# Patient Record
Sex: Female | Born: 1995 | Race: Black or African American | Hispanic: No | Marital: Single | Smoking: Never smoker
Health system: Southern US, Community
[De-identification: ages and names within clinical notes are randomized; demographics above are authoritative.]

## PROBLEM LIST (undated history)

## (undated) ENCOUNTER — Inpatient Hospital Stay (HOSPITAL_COMMUNITY): Payer: Self-pay

## (undated) DIAGNOSIS — N390 Urinary tract infection, site not specified: Secondary | ICD-10-CM

## (undated) DIAGNOSIS — O139 Gestational [pregnancy-induced] hypertension without significant proteinuria, unspecified trimester: Secondary | ICD-10-CM

## (undated) DIAGNOSIS — A749 Chlamydial infection, unspecified: Secondary | ICD-10-CM

## (undated) DIAGNOSIS — L732 Hidradenitis suppurativa: Secondary | ICD-10-CM

## (undated) HISTORY — DX: Hidradenitis suppurativa: L73.2

---
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
## 2015-01-01 ENCOUNTER — Ambulatory Visit (INDEPENDENT_AMBULATORY_CARE_PROVIDER_SITE_OTHER): Payer: No Typology Code available for payment source | Admitting: Family Medicine

## 2015-01-01 ENCOUNTER — Encounter: Payer: Self-pay | Admitting: Family Medicine

## 2015-01-01 VITALS — BP 123/68 | HR 101 | Temp 98.3°F | Wt 199.0 lb

## 2015-01-01 DIAGNOSIS — Z3403 Encounter for supervision of normal first pregnancy, third trimester: Secondary | ICD-10-CM

## 2015-01-01 NOTE — Progress Notes (Signed)
S: Debbie Acevedo is a 19 y.o. G1P0 at [redacted]w[redacted]d    Today reports overall doing well. Family present. Requests letter for out of work since 11/30/14 til delivery and then requesting up to 8 weeks maternity leave for postpartum recovery. - Taking PNV daily, not taking Tylenol - Weight gain appropriate appetite - Denies any nausea, vomiting, headaches, vision changes, worsening edema, dysuria  Fetal Movement - daily Pain / Contractions - No pain or contractions. Does feel some pressure.       Denies any fluid leakage, vaginal bleeding, or discharge  O: BP 123/68 mmHg  Pulse 101  Temp(Src) 98.3 F (36.8 C)  Wt 199 lb (90.266 kg)  LMP 04/19/2014 (Within Days)  Gen - well-appearing, NAD Abd - soft, appropriately gravid for GA, NTND, +active BS Ext - non-tender, no edema, peripheral pulses intact +2 b/l  A&P: Normal supervision of first pregnancy, G1P0, in 3rd trimester, at [redacted]w[redacted]d - cont daily PNV, may take Tylenol PRN - fetal movement and labor precautions reviewed - Reviewed GBS negative results - Additionally reviewed test of cure chlamydia s/p antibiotics, asymptomatic  Return in 1 week for next Routine OB Visit at [redacted] wks GA - Discuss scheduling induction and order post-dates >40wd0 testing with NST / AFI (at [facility])

## 2015-01-01 NOTE — Assessment & Plan Note (Signed)
See vitals and notes for detail

## 2015-01-01 NOTE — Patient Instructions (Signed)
Dear Debbie Acevedo, Thank you for coming in to clinic today. It was good to see you!  Please go directly to [facility] ([facility]) if: (also make sure that you tell them you are a Family Medicine Patient)  You begin to have strong, frequent contractions  Your water breaks. Sometimes it is a big gush of fluid, sometimes it is just a trickle that keeps getting your panties wet or running down your legs  You have vaginal bleeding. It is normal to have a small amount of spotting if your cervix was checked.   You don't feel your baby moving like normal. If you don't, get you something to eat and drink and lay down and focus on feeling your baby move. You should feel at least 10 movements in 2 hours. If you don't, you should call the office or go to [facility].  If you feel like your baby is not moving regularly, then it is important to keep track of movements. Normal Movements - 10 movements in 2 hours - (quiet, resting, usually on Left side, feeling belly, may drink/snack, no other distractions, TV, music, or other noises) - OR - 10 movements in 12 hours - (daily activities, not focused on finding movements)  If you try to feel movements using the above tips for 2 hours, and still do not feel movements, please go to [facility] ([facility]) to get checked out and placed on the Fetal Monitor.  Please schedule a follow-up appointment with Dr. Sherril Cong or Dr. Gerarda Fraction or Dr. Parks Ranger in 1 week, at 39 weeks - discuss Induction options  If you have any other questions or concerns, please feel free to call the clinic to contact me. You may also schedule an earlier appointment if necessary.  However, if your symptoms get significantly worse, please go to the Emergency Department to seek immediate medical attention.  Debbie Acevedo, Debbie Acevedo

## 2015-01-09 ENCOUNTER — Ambulatory Visit (INDEPENDENT_AMBULATORY_CARE_PROVIDER_SITE_OTHER): Payer: No Typology Code available for payment source | Admitting: Obstetrics and Gynecology

## 2015-01-09 VITALS — BP 130/60 | HR 102 | Temp 98.2°F | Wt 200.0 lb

## 2015-01-09 DIAGNOSIS — Z3403 Encounter for supervision of normal first pregnancy, third trimester: Secondary | ICD-10-CM

## 2015-01-09 NOTE — Patient Instructions (Signed)
Third Trimester of Pregnancy The third trimester is from week 29 through week 42, months 7 through 9. This trimester is when your unborn baby (fetus) is growing very fast. At the end of the ninth month, the unborn baby is about 20 inches in length. It weighs about 6-10 pounds.  HOME CARE   Avoid all smoking, herbs, and alcohol. Avoid drugs not approved by your doctor.  Only take medicine as told by your doctor. Some medicines are safe and some are not during pregnancy.  Exercise only as told by your doctor. Stop exercising if you start having cramps.  Eat regular, healthy meals.  Wear a good support bra if your breasts are tender.  Do not use hot tubs, steam rooms, or saunas.  Wear your seat belt when driving.  Avoid raw meat, uncooked cheese, and liter boxes and soil used by cats.  Take your prenatal vitamins.  Try taking medicine that helps you poop (stool softener) as needed, and if your doctor approves. Eat more fiber by eating fresh fruit, vegetables, and whole grains. Drink enough fluids to keep your pee (urine) clear or pale yellow.  Take warm water baths (sitz baths) to soothe pain or discomfort caused by hemorrhoids. Use hemorrhoid cream if your doctor approves.  If you have puffy, bulging veins (varicose veins), wear support hose. Raise (elevate) your feet for 15 minutes, 3-4 times a day. Limit salt in your diet.  Avoid heavy lifting, wear low heels, and sit up straight.  Rest with your legs raised if you have leg cramps or low back pain.  Visit your dentist if you have not gone during your pregnancy. Use a soft toothbrush to brush your teeth. Be gentle when you floss.  You can have sex (intercourse) unless your doctor tells you not to.  Do not travel far distances unless you must. Only do so with your doctor's approval.  Take prenatal classes.  Practice driving to the hospital.  Pack your hospital bag.  Prepare the baby's room.  Go to your doctor visits. GET  HELP IF:  You are not sure if you are in labor or if your water has broken.  You are dizzy.  You have mild cramps or pressure in your lower belly (abdominal).  You have a nagging pain in your belly area.  You continue to feel sick to your stomach (nauseous), throw up (vomit), or have watery poop (diarrhea).  You have bad smelling fluid coming from your vagina.  You have pain with peeing (urination). GET HELP RIGHT AWAY IF:   You have a fever.  You are leaking fluid from your vagina.  You are spotting or bleeding from your vagina.  You have severe belly cramping or pain.  You lose or gain weight rapidly.  You have trouble catching your breath and have chest pain.  You notice sudden or extreme puffiness (swelling) of your face, hands, ankles, feet, or legs.  You have not felt the baby move in over an hour.  You have severe headaches that do not go away with medicine.  You have vision changes. Document Released: 08/20/2009 Document Revised: 09/20/2012 Document Reviewed: 07/27/2012 [facility] Patient Information 2015 [place], [place]. This information is not intended to replace advice given to you by your health care provider. Make sure you discuss any questions you have with your health care provider.    Vaginal Bleeding During Pregnancy, Third Trimester  A small amount of bleeding (spotting) from the vagina is common in pregnancy. Sometimes the  bleeding is normal and is not a problem, and sometimes it is a sign of something serious. Be sure to tell your doctor about any bleeding from your vagina right away. HOME CARE  Watch your condition for any changes.  Follow your doctor's instructions about how active you can be.  If you are on bed rest:  You may need to stay in bed and only get up to use the bathroom.  You may be allowed to do some activities.  If you need help, make plans for someone to help you.  Write down:  The number of pads you use each day.  How  often you change pads.  How soaked (saturated) your pads are.  Do not use tampons.  Do not douche.  Do not have sex or orgasms until your doctor says it is okay.  Follow your doctor's advice about lifting, driving, and doing physical activities.  If you pass any tissue from your vagina, save the tissue so you can show it to your doctor.  Only take medicines as told by your doctor.  Do not take aspirin because it can make you bleed.  Keep all follow-up visits as told by your doctor. GET HELP IF:   You bleed from your vagina.  You have cramps.  You have labor pains.  You have a fever that does not go away after you take medicine. GET HELP RIGHT AWAY IF:  You have very bad cramps in your back or belly (abdomen).  You have chills.  You have a gush of fluid from your vagina.  You pass large clots or tissue from your vagina.  You bleed more.  You feel light-headed or weak.  You pass out (faint).  You do not feel your baby move around as much as before. MAKE SURE YOU:  Understand these instructions.  Will watch your condition.  Will get help right away if you are not doing well or get worse. Document Released: 10/10/2013 Document Reviewed: 01/31/2013 [facility] Patient Information 2015 [place]. This information is not intended to replace advice given to you by your health care provider. Make sure you discuss any questions you have with your health care provider.

## 2015-01-09 NOTE — Progress Notes (Signed)
Debbie Acevedo is a 19 y.o. G1P0 at [redacted]w[redacted]d for routine follow up.  She reports no complaints. Has been taking her PNV.   +FM, denies LOF, VB,  vaginal discharge. +contractions (irregular)  See flow sheet for details.  A/P: Pregnancy at [redacted]w[redacted]d.  Doing well.  Pregnancy progressing well.  Patient scheduled for biweekly BPP/NST as she will be 40w GA on 8/8(EDC). Induction scheduled for 8/15 (41w GA) Infant feeding choice: Breast Contraception choice: Nexplanon GBS/GC/CZ results were reviewed today.  All negative.  Labor precautions reviewed. Kick counts reviewed. Handout given; please see AVS.  Luiz Blare, DO 01/09/2015, 4:50 PM PGY-2, [facility]

## 2015-01-09 NOTE — Assessment & Plan Note (Signed)
See note for details. Overview updated.

## 2015-01-15 ENCOUNTER — Other Ambulatory Visit: Payer: Self-pay | Admitting: Obstetrics and Gynecology

## 2015-01-15 ENCOUNTER — Encounter: Payer: No Typology Code available for payment source | Admitting: Family Medicine

## 2015-01-15 ENCOUNTER — Telehealth (HOSPITAL_COMMUNITY): Payer: Self-pay | Admitting: *Deleted

## 2015-01-15 DIAGNOSIS — O480 Post-term pregnancy: Secondary | ICD-10-CM

## 2015-01-15 DIAGNOSIS — Z3A40 40 weeks gestation of pregnancy: Secondary | ICD-10-CM

## 2015-01-15 NOTE — Telephone Encounter (Signed)
Preadmission screen  

## 2015-01-16 ENCOUNTER — Encounter (HOSPITAL_COMMUNITY): Payer: Self-pay | Admitting: *Deleted

## 2015-01-16 ENCOUNTER — Inpatient Hospital Stay (HOSPITAL_COMMUNITY)
Admission: AD | Admit: 2015-01-16 | Discharge: 2015-01-16 | Disposition: A | Discharge status: Home / Self Care | Payer: No Typology Code available for payment source | Source: Ambulatory Visit | Attending: Family Medicine | Admitting: Family Medicine

## 2015-01-16 ENCOUNTER — Inpatient Hospital Stay (HOSPITAL_COMMUNITY)
Admission: AD | Admit: 2015-01-16 | Discharge: 2015-01-18 | DRG: 775 | Disposition: A | Discharge status: Home / Self Care | Payer: No Typology Code available for payment source | Source: Ambulatory Visit | Attending: Obstetrics & Gynecology | Admitting: Obstetrics & Gynecology

## 2015-01-16 DIAGNOSIS — IMO0001 Reserved for inherently not codable concepts without codable children: Secondary | ICD-10-CM

## 2015-01-16 DIAGNOSIS — Z30018 Encounter for initial prescription of other contraceptives: Secondary | ICD-10-CM

## 2015-01-16 DIAGNOSIS — Z3A40 40 weeks gestation of pregnancy: Secondary | ICD-10-CM

## 2015-01-16 DIAGNOSIS — Z8249 Family history of ischemic heart disease and other diseases of the circulatory system: Secondary | ICD-10-CM | POA: Diagnosis not present

## 2015-01-16 LAB — CBC
HCT: 30.6 % — ABNORMAL LOW (ref 36.0–46.0)
Hemoglobin: 10 g/dL — ABNORMAL LOW (ref 12.0–15.0)
MCH: 26.9 pg (ref 26.0–34.0)
MCHC: 32.7 g/dL (ref 30.0–36.0)
MCV: 82.3 fL (ref 78.0–100.0)
Platelets: 419 10*3/uL — ABNORMAL HIGH (ref 150–400)
RBC: 3.72 MIL/uL — ABNORMAL LOW (ref 3.87–5.11)
RDW: 15.5 % (ref 11.5–15.5)
WBC: 20 10*3/uL — ABNORMAL HIGH (ref 4.0–10.5)

## 2015-01-16 LAB — ABO/RH: ABO/RH(D): B NEG

## 2015-01-16 LAB — TYPE AND SCREEN
ABO/RH(D): B NEG
Antibody Screen: NEGATIVE

## 2015-01-16 MED ORDER — ACETAMINOPHEN 325 MG PO TABS
650.0000 mg | ORAL_TABLET | ORAL | Status: DC | PRN
  Administered 2015-01-16: 650 mg via ORAL
  Filled 2015-01-16: qty 2

## 2015-01-16 MED ORDER — ONDANSETRON HCL 4 MG/2ML IJ SOLN: 4.0000 mg | INTRAMUSCULAR | Status: DC | PRN

## 2015-01-16 MED ORDER — LACTATED RINGERS IV SOLN: 500.0000 mL | INTRAVENOUS | Status: DC | PRN

## 2015-01-16 MED ORDER — LACTATED RINGERS IV SOLN: INTRAVENOUS | Status: DC

## 2015-01-16 MED ORDER — TETANUS-DIPHTH-ACELL PERTUSSIS 5-2.5-18.5 LF-MCG/0.5 IM SUSP: 0.5000 mL | Freq: Once | INTRAMUSCULAR | Status: DC

## 2015-01-16 MED ORDER — SIMETHICONE 80 MG PO CHEW: 80.0000 mg | CHEWABLE_TABLET | ORAL | Status: DC | PRN

## 2015-01-16 MED ORDER — PRENATAL MULTIVITAMIN CH
1.0000 | ORAL_TABLET | Freq: Every day | ORAL | Status: DC
  Administered 2015-01-17 – 2015-01-18 (×2): 1 via ORAL
  Filled 2015-01-16 (×2): qty 1

## 2015-01-16 MED ORDER — OXYCODONE-ACETAMINOPHEN 5-325 MG PO TABS: 2.0000 | ORAL_TABLET | ORAL | Status: DC | PRN

## 2015-01-16 MED ORDER — OXYTOCIN 10 UNIT/ML IJ SOLN
INTRAMUSCULAR | Status: AC
  Administered 2015-01-16: 10 [IU]
  Filled 2015-01-16: qty 1

## 2015-01-16 MED ORDER — SENNOSIDES-DOCUSATE SODIUM 8.6-50 MG PO TABS
2.0000 | ORAL_TABLET | ORAL | Status: DC
  Administered 2015-01-17: 2 via ORAL
  Filled 2015-01-16: qty 2

## 2015-01-16 MED ORDER — ACETAMINOPHEN 325 MG PO TABS: 650.0000 mg | ORAL_TABLET | ORAL | Status: DC | PRN

## 2015-01-16 MED ORDER — CITRIC ACID-SODIUM CITRATE 334-500 MG/5ML PO SOLN: 30.0000 mL | ORAL | Status: DC | PRN

## 2015-01-16 MED ORDER — OXYCODONE-ACETAMINOPHEN 5-325 MG PO TABS: 1.0000 | ORAL_TABLET | ORAL | Status: DC | PRN

## 2015-01-16 MED ORDER — OXYTOCIN BOLUS FROM INFUSION: 500.0000 mL | INTRAVENOUS | Status: DC

## 2015-01-16 MED ORDER — DIBUCAINE 1 % RE OINT: 1.0000 "application " | TOPICAL_OINTMENT | RECTAL | Status: DC | PRN

## 2015-01-16 MED ORDER — BENZOCAINE-MENTHOL 20-0.5 % EX AERO: 1.0000 "application " | INHALATION_SPRAY | CUTANEOUS | Status: DC | PRN

## 2015-01-16 MED ORDER — LIDOCAINE HCL (PF) 1 % IJ SOLN
30.0000 mL | INTRAMUSCULAR | Status: AC | PRN
  Administered 2015-01-16: 30 mL via SUBCUTANEOUS
  Filled 2015-01-16: qty 30

## 2015-01-16 MED ORDER — FLEET ENEMA 7-19 GM/118ML RE ENEM: 1.0000 | ENEMA | RECTAL | Status: DC | PRN

## 2015-01-16 MED ORDER — OXYTOCIN 40 UNITS IN LACTATED RINGERS INFUSION - SIMPLE MED
62.5000 mL/h | INTRAVENOUS | Status: DC
  Filled 2015-01-16: qty 1000

## 2015-01-16 MED ORDER — ONDANSETRON HCL 4 MG PO TABS: 4.0000 mg | ORAL_TABLET | ORAL | Status: DC | PRN

## 2015-01-16 MED ORDER — ZOLPIDEM TARTRATE 5 MG PO TABS: 5.0000 mg | ORAL_TABLET | Freq: Every evening | ORAL | Status: DC | PRN

## 2015-01-16 MED ORDER — DIPHENHYDRAMINE HCL 25 MG PO CAPS: 25.0000 mg | ORAL_CAPSULE | Freq: Four times a day (QID) | ORAL | Status: DC | PRN

## 2015-01-16 MED ORDER — ONDANSETRON HCL 4 MG/2ML IJ SOLN: 4.0000 mg | Freq: Four times a day (QID) | INTRAMUSCULAR | Status: DC | PRN

## 2015-01-16 MED ORDER — LANOLIN HYDROUS EX OINT: TOPICAL_OINTMENT | CUTANEOUS | Status: DC | PRN

## 2015-01-16 MED ORDER — IBUPROFEN 600 MG PO TABS
600.0000 mg | ORAL_TABLET | Freq: Four times a day (QID) | ORAL | Status: DC
  Administered 2015-01-16 – 2015-01-18 (×7): 600 mg via ORAL
  Filled 2015-01-16 (×7): qty 1

## 2015-01-16 MED ORDER — WITCH HAZEL-GLYCERIN EX PADS: 1.0000 "application " | MEDICATED_PAD | CUTANEOUS | Status: DC | PRN

## 2015-01-16 NOTE — MAU Note (Signed)
Having contractions since 7pm. No leaking fluid or blood

## 2015-01-16 NOTE — MAU Note (Signed)
Fh tracing at 1300 located on MRN [id number]

## 2015-01-16 NOTE — MAU Note (Signed)
Pt presents to MAU with strong urge to push, VE pt complete.

## 2015-01-16 NOTE — H&P (Signed)
LABOR AND DELIVERY ADMISSION HISTORY AND PHYSICAL NOTE  Debbie Acevedo is a 19 y.o. female [place] with IUP at [redacted]w[redacted]d by 10-week u/s presenting for contractions, found to be completely dilated and +2 station in our triage. She reports +FMs, No LOF, no VB, no blurry vision, headaches or peripheral edema, and RUQ pain.  She plans on breast feeding.    Sono:  Echogenic cardiac focus seen on anatomy scan and repeat, otherwise wnl   Prenatal History/Complications:  Past Medical History: Past Medical History  Diagnosis Date  . Hidradenitis suppurativa 2009    in both axilla and groin   . Urinary tract infection     current, on antibiotic    Past Surgical History: Past Surgical History  Procedure Laterality Date  . Cholecystectomy N/A 10/22/2012    Procedure: LAPAROSCOPIC CHOLECYSTECTOMY WITHOUT INTRAOPERATIVE CHOLANGIOGRAM;  Surgeon: Jerilynn Mages. Debbie Stabs, MD;  Location: [facility];  Service: Pediatrics;  Laterality: N/A;    Obstetrical History: OB History    Gravida Para Term Preterm AB TAB SAB Ectopic Multiple Living   1 0 0 0 0 0 0 0 0 1       Social History: History   Social History  . Marital Status: Single    Spouse Name: N/A  . Number of Children: N/A  . Years of Education: N/A   Social History Main Topics  . Smoking status: Never Smoker   . Smokeless tobacco: Never Used  . Alcohol Use: No  . Drug Use: No  . Sexual Activity: Yes    Birth Control/ Protection: None   Other Topics Concern  . None   Social History Narrative    Family History: Family History  Problem Relation Age of Onset  . Cancer Maternal Aunt   . Hypertension Maternal Grandmother   . Cancer Maternal Grandfather     Allergies: No Known Allergies  Prescriptions prior to admission  Medication Sig Dispense Refill Last Dose  . Doxylamine-Pyridoxine 10-10 MG TBEC Take 1 tablet by mouth 2 (two) times daily. (Patient not taking: Reported on 09/18/2014) 60 tablet 1   . Prenatal Vit-Fe Fumarate-FA  (PRENATAL MULTIVITAMIN) TABS tablet Take 1 tablet by mouth daily at 12 noon.   01/15/2015 at Unknown time  . promethazine (PHENERGAN) 25 MG suppository Place 1 suppository (25 mg total) rectally every 6 (six) hours as needed for nausea or vomiting. 12 each 0 01/16/2015 at Unknown time     Review of Systems   All systems reviewed and negative except as stated in HPI  Blood pressure 133/72, pulse 105, resp. rate 18, height 5\' 5"  (1.651 m), weight 199 lb (90.266 kg), last menstrual period 04/19/2014. General appearance: alert and cooperative Lungs: clear to auscultation bilaterally Heart: regular rate and rhythm Abdomen: soft, non-tender; bowel sounds normal Pelvic: complete/complete/+2 Extremities: Homans sign is negative, no sign of DVT Presentation: cephalic Fetal monitoringBaseline: 130 bpm Uterine activity: q2 min   Prenatal labs: ABO, Rh: B/NEG/-- (01/15 1649) Antibody: NEG (05/19 1649) Rubella:   RPR: NON REAC (05/19 1649)  HBsAg: NEGATIVE (01/15 1649)  HIV: NONREACTIVE (05/19 1649)  GBS: NOT DETECTED (07/11 1537)  1 hr Glucola 105 Genetic screening  Quad negative   Prenatal Transfer Tool  Maternal Diabetes: No Genetic Screening: Normal (quad) Maternal Ultrasounds/Referrals: Abnormal:  Findings:   Isolated EIF (echogenic intracardiac focus) Fetal Ultrasounds or other Referrals:  None Maternal Substance Abuse:  No Significant Maternal Medications:  None Significant Maternal Lab Results: Lab values include: Rh negative  No results found for  this or any previous visit (from the past 24 hour(s)).  Patient Active Problem List   Diagnosis Date Noted  . Active labor at term 01/16/2015  . Chlamydia infection, current pregnancy 12/22/2014  . Echogenic intracardiac focus of fetus on prenatal ultrasound   . Supervision of normal first pregnancy 06/23/2014  . Carbuncle 02/27/2014  . Ganglion cyst of wrist 05/14/2012  . Hidradenitis suppurativa 07/14/2011  . Biliary colic  17/71/1657  . Benign mole 10/04/2010    Assessment: Debbie Acevedo is a 19 y.o. G1P0001 at [redacted]w[redacted]d here for labor. Arrived completely dilated and proceeded shortly therafter with NSVD  #Labor: s/p NSVD #Pain: n/a #FWB: Vigorous female infant, see delivery summary #ID:  gbs negative #MOF: breast #MOC:nexplanon, likely as inpatient #Circ:  N/a # Rhogam: vaccinate postpartum  Debbie Acevedo 01/16/2015, 1:50 PM

## 2015-01-16 NOTE — Discharge Instructions (Signed)
Braxton Hicks Contractions °Contractions of the uterus can occur throughout pregnancy. Contractions are not always a sign that you are in labor.  °WHAT ARE BRAXTON HICKS CONTRACTIONS?  °Contractions that occur before labor are called Braxton Hicks contractions, or false labor. Toward the end of pregnancy (32-34 weeks), these contractions can develop more often and may become more forceful. This is not true labor because these contractions do not result in opening (dilatation) and thinning of the cervix. They are sometimes difficult to tell apart from true labor because these contractions can be forceful and people have different pain tolerances. You should not feel embarrassed if you go to the hospital with false labor. Sometimes, the only way to tell if you are in true labor is for your health care provider to look for changes in the cervix. °If there are no prenatal problems or other health problems associated with the pregnancy, it is completely safe to be sent home with false labor and await the onset of true labor. °HOW CAN YOU TELL THE DIFFERENCE BETWEEN TRUE AND FALSE LABOR? °False Labor °· The contractions of false labor are usually shorter and not as hard as those of true labor.   °· The contractions are usually irregular.   °· The contractions are often felt in the front of the lower abdomen and in the groin.   °· The contractions may go away when you walk around or change positions while lying down.   °· The contractions get weaker and are shorter lasting as time goes on.   °· The contractions do not usually become progressively stronger, regular, and closer together as with true labor.   °True Labor °· Contractions in true labor last 30-70 seconds, become very regular, usually become more intense, and increase in frequency.   °· The contractions do not go away with walking.   °· The discomfort is usually felt in the top of the uterus and spreads to the lower abdomen and low back.   °· True labor can be  determined by your health care provider with an exam. This will show that the cervix is dilating and getting thinner.   °WHAT TO REMEMBER °· Keep up with your usual exercises and follow other instructions given by your health care provider.   °· Take medicines as directed by your health care provider.   °· Keep your regular prenatal appointments.   °· Eat and drink lightly if you think you are going into labor.   °· If Braxton Hicks contractions are making you uncomfortable:   °¨ Change your position from lying down or resting to walking, or from walking to resting.   °¨ Sit and rest in a tub of warm water.   °¨ Drink 2-3 glasses of water. Dehydration may cause these contractions.   °¨ Do slow and deep breathing several times an hour.   °WHEN SHOULD I SEEK IMMEDIATE MEDICAL CARE? °Seek immediate medical care if: °· Your contractions become stronger, more regular, and closer together.   °· You have fluid leaking or gushing from your vagina.   °· You have a fever.   °· You pass blood-tinged mucus.   °· You have vaginal bleeding.   °· You have continuous abdominal pain.   °· You have low back pain that you never had before.   °· You feel your baby's head pushing down and causing pelvic pressure.   °· Your baby is not moving as much as it used to.   °Document Released: 05/26/2005 Document Revised: 05/31/2013 Document Reviewed: 03/07/2013 °[employer]® Patient Information ©2015 [employer]. This information is not intended to replace advice given to you by your health care   provider. Make sure you discuss any questions you have with your health care provider. ° °

## 2015-01-17 ENCOUNTER — Encounter: Payer: Medicaid Other | Admitting: Obstetrics and Gynecology

## 2015-01-17 DIAGNOSIS — Z30018 Encounter for initial prescription of other contraceptives: Secondary | ICD-10-CM

## 2015-01-17 LAB — RPR: RPR: NONREACTIVE

## 2015-01-17 MED ORDER — ETONOGESTREL 68 MG ~~LOC~~ IMPL
68.0000 mg | DRUG_IMPLANT | Freq: Once | SUBCUTANEOUS | Status: AC
  Administered 2015-01-17: 68 mg via SUBCUTANEOUS
  Filled 2015-01-17: qty 1

## 2015-01-17 MED ORDER — LIDOCAINE HCL 1 % IJ SOLN
0.0000 mL | Freq: Once | INTRAMUSCULAR | Status: DC | PRN
  Filled 2015-01-17: qty 20

## 2015-01-17 NOTE — Lactation Note (Signed)
This note was copied from the chart of Girl Jakaya Wentland. Lactation Consultation Note  Patient Name: Girl Branden Vine NUUVO'Z Date: 01/17/2015 Reason for consult: Initial assessment Baby 84 hours old. Mom reports that baby was latching and nursing well during the night, but has been latching as well today. Assisted mom to latch baby in football position to left breast. Baby not latch readily, so demonstrated to mom how to evert her nipple using a hand pump. Baby able to latch deeply, suckle rhythmically, with a few swallows noted. Mom states that she has been able to hand express colostrum through the night. Enc mom to continue to nurse with cues and call for assistance as needed. Mom given [facility] brochure, aware of OP/BFSG, community resources, and [facility] phone line assistance after D/C. Enc mom to call for assistance as needed.   Maternal Data Has patient been taught Hand Expression?: Yes Does the patient have breastfeeding experience prior to this delivery?: No  Feeding Feeding Type: Breast Fed  LATCH Score/Interventions Latch: Repeated attempts needed to sustain latch, nipple held in mouth throughout feeding, stimulation needed to elicit sucking reflex. Intervention(s): Adjust position;Assist with latch;Breast compression  Audible Swallowing: A few with stimulation Intervention(s): Skin to skin;Hand expression  Type of Nipple: Flat Intervention(s): Hand pump  Comfort (Breast/Nipple): Soft / non-tender     Hold (Positioning): Assistance needed to correctly position infant at breast and maintain latch. Intervention(s): Breastfeeding basics reviewed;Support Pillows;Position options;Skin to skin  LATCH Score: 6  Lactation Tools Discussed/Used     Consult Status Consult Status: Follow-up Date: 01/18/15 Follow-up type: In-patient    Inocente Salles 01/17/2015, 12:30 PM

## 2015-01-17 NOTE — Procedures (Signed)
PROCEDURE NOTE: Nexplanon Insertion  Patient given informed consent, signed copy in the chart, time out was performed.  S/p delivery on 01/16/2015  Patient's left arm was prepped and draped in the usual sterile fashion. The ruler used to measure and mark insertion area. Pt was prepped with alcohol swab and then injected with 3cc of  1% lidocaine with epinephrine used to anesthetize the area.  Pt was prepped with betadine, nexplanon removed from packaging, device confirmed in needle, then inserted full length of needle and withdrawn per manufacturer's instructions. Minimal blood loss. The insertion site covered with a pressure bandage to minimize bruising. There were no complications and the patient tolerated the procedure well.  Device information was given in handout form. Patient is informed the removal date will be in three years and package insert card filled out and given to her.  Luiz Blare, DO 01/17/2015, 6:26 PM PGY-2, [place]

## 2015-01-17 NOTE — Progress Notes (Signed)
Post Partum Day #1  Subjective: Debbie Acevedo is a 19 y.o. G1P1001 [redacted]w[redacted]d s/p SVD of viable term baby girl.  No acute events overnight.  Pt denies problems with ambulating, voiding or po intake.  She denies nausea or vomiting.  Pain is well controlled. She has had bowel movement.  Lochia Small.  Plan for birth control is Nexplanon.  Method of Feeding: Breast.   Objective: Blood pressure 125/60, pulse 90, temperature 98.7 F (37.1 C), temperature source Oral, resp. rate 18, height 5\' 5"  (1.651 m), weight 90.266 kg (199 lb), last menstrual period 04/19/2014, SpO2 100 %, unknown if currently breastfeeding.  Physical Exam:  General: alert, cooperative and no distress, appears comfortable Lochia: normal flow Chest: Normal work of breathing Heart: RRR no murmur Abdomen: soft, nontender Uterine Fundus: firm DVT Evaluation: No evidence of DVT seen on physical exam. Extremities: No LE edema   Recent Labs  01/16/15 1345  HGB 10.0*  HCT 30.6*    Assessment/Plan:  ASSESSMENT: Debbie Acevedo is a 19 y.o. G1P1001 [redacted]w[redacted]d s/p SVD, overall doing well on PPD#1.  Plan for inpatient insertion of Nexplanon.    Plan for discharge tomorrow, Breastfeeding, Lactation consult and Social Work consult   LOS: 1 day   Ladell Pier 01/17/2015, 7:04 AM    OB FELLOW POSTPARTUM PROGRESS NOTE ATTESTATION  Post Partum Day 1 I have seen and examined this patient and agree with above documentation in the medical student's note.   Debbie Acevedo is a 19 y.o. G1P1001 s/p nsvd.  Pt denies problems with ambulating, voiding or po intake. Pain is well controlled.    PE:  BP 114/83 mmHg  Pulse 86  Temp(Src) 98.8 F (37.1 C) (Oral)  Resp 18  Ht 5\' 5"  (1.651 m)  Wt 199 lb (90.266 kg)  BMI 33.12 kg/m2  SpO2 100%  LMP 04/19/2014 (Within Days)  Breastfeeding? Unknown Fundus firm  Plan for discharge: ppd2 to allow for breastfeeding support.  Desma Maxim, MD 7:46 AM

## 2015-01-17 NOTE — Progress Notes (Cosign Needed)
Post Partum Day 1  Subjective:  Debbie Acevedo is a 19 y.o. G1P1001 [redacted]w[redacted]d s/p SVD.  No acute events overnight.  Pt denies problems with ambulating, voiding or po intake.  She denies nausea or vomiting.  Pain is well controlled.  She has not had flatus. She has had bowel movement; small.  Lochia Small.  Plan for birth control is Nexplanon.  Method of Feeding: Breast.  Having some difficulty with breastfeeding.  Objective: BP 114/83 mmHg  Pulse 86  Temp(Src) 98.8 F (37.1 C) (Oral)  Resp 18  Ht 5\' 5"  (1.651 m)  Wt 199 lb (90.266 kg)  BMI 33.12 kg/m2  SpO2 100%  LMP 04/19/2014 (Within Days)  Breastfeeding? Unknown  Physical Exam:  General: alert, cooperative and no distress Lochia:normal flow Chest: CTAB Heart: RRR no m/r/g Abdomen: +BS, soft, nontender, fundus firm  DVT Evaluation: No evidence of DVT seen on physical exam. Extremities: no edema   Recent Labs  01/16/15 1345  HGB 10.0*  HCT 30.6*    Assessment/Plan:  ASSESSMENT: Debbie Acevedo is a 19 y.o. G1P1001 [redacted]w[redacted]d ppd #1 s/p NSVD doing well.   Plan for discharge tomorrow Breastfeeding but needs extra support - Lactation consult placed Nexplanon will be placed before discharge by FM team No need for CSW consult. Patient with great support system and responsible.    LOS: 1 day   Luiz Blare, DO 01/17/2015, 9:49 AM PGY-2, [place]

## 2015-01-18 ENCOUNTER — Ambulatory Visit (HOSPITAL_COMMUNITY): Payer: No Typology Code available for payment source

## 2015-01-18 MED ORDER — IBUPROFEN 600 MG PO TABS: 600.0000 mg | ORAL_TABLET | Freq: Four times a day (QID) | ORAL | Status: DC

## 2015-01-18 MED ORDER — RHO D IMMUNE GLOBULIN 1500 UNIT/2ML IJ SOSY
300.0000 ug | PREFILLED_SYRINGE | Freq: Once | INTRAMUSCULAR | Status: AC
  Administered 2015-01-18: 300 ug via INTRAMUSCULAR
  Filled 2015-01-18: qty 2

## 2015-01-18 NOTE — Lactation Note (Signed)
This note was copied from the chart of Debbie Acevedo. Lactation Consultation Note  Patient Name: Debbie Acevedo MCNOB'[id number] Date: 01/18/2015 Reason for consult: Follow-up assessment   With this mom and term baby, now 16 hours old. Mom reports breast feeding gong well. Her breast a re full and slightly tender, and with hand expression, it appears she is transitioning into mature milk. Breast care reviewed with mom. Mom knows to call lactation  for questions/concerns.    Maternal Data    Feeding Feeding Type: Breast Fed Length of feed: 25 min  LATCH Score/Interventions                      Lactation Tools Discussed/Used     Consult Status Consult Status: Complete Follow-up type: Call as needed    Tonna Corner 01/18/2015, 11:11 AM

## 2015-01-18 NOTE — Discharge Summary (Signed)
Obstetric Discharge Summary Reason for Admission: onset of labor Prenatal Procedures: none Intrapartum Procedures: spontaneous vaginal delivery Postpartum Procedures: Rho(D) Ig and Nexplanon insertion Complications-Operative and Postpartum: none HEMOGLOBIN  Date Value Ref Range Status  01/16/2015 10.0* 12.0 - 15.0 g/dL Final   HCT  Date Value Ref Range Status  01/16/2015 30.6* 36.0 - 46.0 % Final    Physical Exam:  General: alert, cooperative and no distress Lochia: appropriate Uterine Fundus: firm Incision: n/a DVT Evaluation: No evidence of DVT seen on physical exam. No cords or calf tenderness. No significant calf/ankle edema.  Discharge Diagnoses: Term Pregnancy-delivered  Discharge Information: Date: 01/18/2015 Activity: pelvic rest Diet: routine Medications: PNV and Ibuprofen Condition: stable Instructions: refer to practice specific booklet Discharge to: home  Birth control: nexplanon inserted 01/17/15 Follow-up Information    Follow up with Luiz Blare, DO On 03/02/2015.   Specialty:  Family Medicine   Why:  Appointment scheduled at 2:30pm   Contact information:   [address] [place] [address] [phone]       Newborn Data: Live born female  Birth Weight: 7 lb 1.4 oz (3215 g) APGAR: 9, 9  Home with mother.  Beverlyn Roux 01/18/2015, 8:11 AM   CNM attestation I have seen and examined this patient and agree with above documentation in the resident's note.   Debbie Acevedo is a 19 y.o. G1P1001 s/p SVD.   Pain is well controlled.  Plan for birth control is Nexplanon (inserted).  Method of Feeding: breast  PE:  BP 130/86 mmHg  Pulse 86  Temp(Src) 98 F (36.7 C) (Oral)  Resp 20  Ht 5\' 5"  (1.651 m)  Wt 90.266 kg (199 lb)  BMI 33.12 kg/m2  SpO2 100%  LMP 04/19/2014 (Within Days)  Breastfeeding? Unknown Fundus firm   Recent Labs  01/16/15 1345  HGB 10.0*  HCT 30.6*     Plan: discharge today - postpartum care discussed -  f/u clinic in 6 weeks for postpartum visit   Serita Grammes, CNM 10:32 AM  01/18/2015

## 2015-01-18 NOTE — Discharge Instructions (Signed)

## 2015-01-19 LAB — RH IG WORKUP (INCLUDES ABO/RH)
ABO/RH(D): B NEG
FETAL SCREEN: NEGATIVE
GESTATIONAL AGE(WKS): 40.1
UNIT DIVISION: 0

## 2015-01-22 ENCOUNTER — Inpatient Hospital Stay (HOSPITAL_COMMUNITY): Admission: RE | Admit: 2015-01-22 | Payer: No Typology Code available for payment source | Source: Ambulatory Visit

## 2015-03-02 ENCOUNTER — Ambulatory Visit (INDEPENDENT_AMBULATORY_CARE_PROVIDER_SITE_OTHER): Payer: Medicaid Other | Admitting: Obstetrics and Gynecology

## 2015-03-02 ENCOUNTER — Encounter: Payer: Self-pay | Admitting: Obstetrics and Gynecology

## 2015-03-02 DIAGNOSIS — F411 Generalized anxiety disorder: Secondary | ICD-10-CM

## 2015-03-02 DIAGNOSIS — O99345 Other mental disorders complicating the puerperium: Secondary | ICD-10-CM

## 2015-03-02 DIAGNOSIS — F418 Other specified anxiety disorders: Secondary | ICD-10-CM

## 2015-03-02 DIAGNOSIS — Z23 Encounter for immunization: Secondary | ICD-10-CM

## 2015-03-02 NOTE — Progress Notes (Signed)
  Subjective:     Debbie Acevedo is a 19 y.o. female who presents for a postpartum visit. She is 6 weeks postpartum following a spontaneous vaginal delivery. I have fully reviewed the prenatal and intrapartum course. The delivery was at [redacted]w[redacted]d gestational weeks. Outcome: spontaneous vaginal delivery. Anesthesia: none. Postpartum course has been uncomplicated. Baby's course has been uncomplicated. Baby is feeding by breast. Bleeding no bleeding. Bowel function is normal. Bladder function is normal. Patient is not sexually active. Contraception method is Nexplanon. Postpartum depression screening: negative.  The following portions of the patient's history were reviewed and updated as appropriate: allergies, current medications, past family history, past medical history, past social history, past surgical history and problem list.  Review of Systems A comprehensive review of systems was negative.   Objective:    BP 122/72 mmHg  Pulse 75  Temp(Src) 98.5 F (36.9 C) (Oral)  Ht 5\' 5"  (1.651 m)  Wt 177 lb (80.287 kg)  BMI 29.45 kg/m2  LMP 02/19/2015  Breastfeeding? Yes  General:  alert, cooperative and no distress   Breasts:  negative  Lungs: clear to auscultation bilaterally  Heart:  regular rate and rhythm, S1, S2 normal, no murmur, click, rub or gallop  Abdomen: soft, non-tender; bowel sounds normal; no masses,  no organomegaly  Extremities:     GU:  not evaluated         Assessment:    Normal postpartum exam. Pap smear not done at today's visit (age not appropriate).   Plan:    1. Contraception: Nexplanon inserted post-partum in hospital. No complications.   2. Anxiety: Patient feeling more anxious about planning life and events with new baby. No signs of post partum depression. Discussed coping mechanisms with patient.   3. Follow up in: 2 months or as needed.    Luiz Blare, DO 03/02/2015, 3:32 PM PGY-2, [place]

## 2015-03-02 NOTE — Patient Instructions (Addendum)
Follow-up with me in 2 months  Postpartum Depression and Baby Blues The postpartum period begins right after the birth of a baby. During this time, there is often a great amount of joy and excitement. It is also a time of many changes in the life of the parents. Regardless of how many times a mother gives birth, each child brings new challenges and dynamics to the family. It is not unusual to have feelings of excitement along with confusing shifts in moods, emotions, and thoughts. All mothers are at risk of developing postpartum depression or the "baby blues." These mood changes can occur right after giving birth, or they may occur many months after giving birth. The baby blues or postpartum depression can be mild or severe. Additionally, postpartum depression can go away rather quickly, or it can be a long-term condition.  CAUSES Raised hormone levels and the rapid drop in those levels are thought to be a main cause of postpartum depression and the baby blues. A number of hormones change during and after pregnancy. Estrogen and progesterone usually decrease right after the delivery of your baby. The levels of thyroid hormone and various cortisol steroids also rapidly drop. Other factors that play a role in these mood changes include major life events and genetics.  RISK FACTORS If you have any of the following risks for the baby blues or postpartum depression, know what symptoms to watch out for during the postpartum period. Risk factors that may increase the likelihood of getting the baby blues or postpartum depression include:  Having a personal or family history of depression.   Having depression while being pregnant.   Having premenstrual mood issues or mood issues related to oral contraceptives.  Having a lot of life stress.   Having marital conflict.   Lacking a social support network.   Having a baby with special needs.   Having health problems, such as diabetes.  SIGNS AND  SYMPTOMS Symptoms of baby blues include:  Brief changes in mood, such as going from extreme happiness to sadness.  Decreased concentration.   Difficulty sleeping.   Crying spells, tearfulness.   Irritability.   Anxiety.  Symptoms of postpartum depression typically begin within the first month after giving birth. These symptoms include:  Difficulty sleeping or excessive sleepiness.   Marked weight loss.   Agitation.   Feelings of worthlessness.   Lack of interest in activity or food.  Postpartum psychosis is a very serious condition and can be dangerous. Fortunately, it is rare. Displaying any of the following symptoms is cause for immediate medical attention. Symptoms of postpartum psychosis include:   Hallucinations and delusions.   Bizarre or disorganized behavior.   Confusion or disorientation.  DIAGNOSIS  A diagnosis is made by an evaluation of your symptoms. There are no medical or lab tests that lead to a diagnosis, but there are various questionnaires that a health care provider may use to identify those with the baby blues, postpartum depression, or psychosis. Often, a screening tool called the [place] Postnatal Depression Scale is used to diagnose depression in the postpartum period.  TREATMENT The baby blues usually goes away on its own in 1-2 weeks. Social support is often all that is needed. You will be encouraged to get adequate sleep and rest. Occasionally, you may be given medicines to help you sleep.  Postpartum depression requires treatment because it can last several months or longer if it is not treated. Treatment may include individual or group therapy, medicine, or both  to address any social, physiological, and psychological factors that may play a role in the depression. Regular exercise, a healthy diet, rest, and social support may also be strongly recommended.  Postpartum psychosis is more serious and needs treatment right away.  Hospitalization is often needed. HOME CARE INSTRUCTIONS  Get as much rest as you can. Nap when the baby sleeps.   Exercise regularly. Some women find yoga and walking to be beneficial.   Eat a balanced and nourishing diet.   Do little things that you enjoy. Have a cup of tea, take a bubble bath, read your favorite magazine, or listen to your favorite music.  Avoid alcohol.   Ask for help with household chores, cooking, grocery shopping, or running errands as needed. Do not try to do everything.   Talk to people close to you about how you are feeling. Get support from your partner, family members, friends, or other new moms.  Try to stay positive in how you think. Think about the things you are grateful for.   Do not spend a lot of time alone.   Only take over-the-counter or prescription medicine as directed by your health care provider.  Keep all your postpartum appointments.   Let your health care provider know if you have any concerns.  SEEK MEDICAL CARE IF: You are having a reaction to or problems with your medicine. SEEK IMMEDIATE MEDICAL CARE IF:  You have suicidal feelings.   You think you may harm the baby or someone else. MAKE SURE YOU:  Understand these instructions.  Will watch your condition.  Will get help right away if you are not doing well or get worse. Document Released: 02/28/2004 Document Revised: 05/31/2013 Document Reviewed: 03/07/2013 [facility] Patient Information 2015 [place], [place]. This information is not intended to replace advice given to you by your health care provider. Make sure you discuss any questions you have with your health care provider.

## 2015-05-01 ENCOUNTER — Ambulatory Visit: Payer: Medicaid Other | Admitting: Internal Medicine

## 2015-05-15 ENCOUNTER — Encounter: Payer: Self-pay | Admitting: Family Medicine

## 2015-05-15 ENCOUNTER — Ambulatory Visit (INDEPENDENT_AMBULATORY_CARE_PROVIDER_SITE_OTHER): Payer: Medicaid Other | Admitting: Family Medicine

## 2015-05-15 VITALS — BP 119/66 | HR 109 | Temp 98.6°F | Ht 65.0 in | Wt 177.2 lb

## 2015-05-15 DIAGNOSIS — L732 Hidradenitis suppurativa: Secondary | ICD-10-CM | POA: Diagnosis not present

## 2015-05-15 MED ORDER — CLINDAMYCIN PHOSPHATE 1 % EX GEL: Freq: Two times a day (BID) | CUTANEOUS | Status: DC

## 2015-05-15 NOTE — Progress Notes (Signed)
Subjective:     Patient ID: Debbie Acevedo, female   DOB: [birth year], 19 y.o.   MRN: [id number]  HPI Boil: Right armpit recurrent since 6 years. Last boil was 2 wks ago, getting better now, she did not use any antibiotic but she felt it might be infected. She avoids deodorants whenever she has a boil. No fever. No change in body lotion or diet. She has her boil at least once a month. She will like a surgical opinion.  Current Outpatient Prescriptions on File Prior to Visit  Medication Sig Dispense Refill  . ibuprofen (ADVIL,MOTRIN) 600 MG tablet Take 1 tablet (600 mg total) by mouth every 6 (six) hours. (Patient not taking: Reported on 03/02/2015) 30 tablet 0  . Prenatal Vit-Fe Fumarate-FA (PRENATAL MULTIVITAMIN) TABS tablet Take 1 tablet by mouth daily at 12 noon.     No current facility-administered medications on file prior to visit.   Past Medical History  Diagnosis Date  . Hidradenitis suppurativa 2009    in both axilla and groin   . Urinary tract infection     current, on antibiotic     Review of Systems  Respiratory: Negative.   Cardiovascular: Negative.   Skin: Positive for wound.  All other systems reviewed and are negative.  Filed Vitals:   05/15/15 1022  BP: 119/66  Pulse: 109  Temp: 98.6 F (37 C)  TempSrc: Oral  Height: 5\' 5"  (1.651 m)  Weight: 177 lb 3 oz (80.372 kg)       Objective:   Physical Exam  Constitutional: She appears well-developed. No distress.  Cardiovascular: Normal rate, regular rhythm, normal heart sounds and intact distal pulses.   No murmur heard. Pulmonary/Chest: Effort normal and breath sounds normal. No respiratory distress. She has no wheezes.  Abdominal: Soft. Bowel sounds are normal. She exhibits no distension and no mass. There is no tenderness.  Skin:     Nursing note and vitals reviewed.        Assessment:     Hidradenitis suppurativa.     Plan:     Check problem list.

## 2015-05-15 NOTE — Patient Instructions (Signed)

## 2015-05-15 NOTE — Assessment & Plan Note (Signed)
Chronic and recurrent. Topical clindamycin prescribed. Perhaps she will improve if tracts are opened. I referred her for surgical evaluation. I discussed weight loss and good hygiene.  I recommended keeping armpit clean and dry while avoiding deodorants as much as possible. F/U as needed.

## 2015-05-16 ENCOUNTER — Telehealth: Payer: Self-pay | Admitting: Family Medicine

## 2015-05-16 MED ORDER — METRONIDAZOLE 0.75 % EX CREA
TOPICAL_CREAM | Freq: Two times a day (BID) | CUTANEOUS | Status: DC
Start: 2015-05-16 — End: 2015-07-20

## 2015-05-16 NOTE — Telephone Encounter (Signed)
Clindamycin not covered by her insurance. i called her pharmacy and they can't give a reason why it was not covered. I will e-scribe metro gel

## 2015-05-21 ENCOUNTER — Telehealth: Payer: Self-pay | Admitting: Obstetrics and Gynecology

## 2015-05-21 NOTE — Telephone Encounter (Signed)
The creme prescribed at last visit---could not get because pharmacist said insurance would pay for it. She has Medicaid. Please send in something that she can take

## 2015-05-23 NOTE — Telephone Encounter (Signed)
Antibiotics I would prescribe orally for this patient are contraindicated because she is breastfeeding. No order was placed. Will forward to Dr. Gwendlyn Deutscher who saw patient to see if she has any suggestions.

## 2015-05-23 NOTE — Telephone Encounter (Signed)
Her insurance does not cover any topical antibiotic (as per what pharmacist could see). Order placed for oral antibiotic.

## 2015-05-23 NOTE — Telephone Encounter (Signed)
Lesion looked like it was healing the last time I saw her, I don't think she need oral A/B now. Have her come back in for reassessment id worsening.

## 2015-05-24 NOTE — Telephone Encounter (Signed)
Patient was made aware of message from providers.  She states that her lesion has healed up and now closed.  She did notice a small one start to form, but it has since "popped" and no longer bothersome. Debbie Acevedo,CMA

## 2015-07-20 ENCOUNTER — Other Ambulatory Visit (HOSPITAL_COMMUNITY)
Admission: RE | Admit: 2015-07-20 | Discharge: 2015-07-20 | Disposition: A | Discharge status: Home / Self Care | Payer: Medicaid Other | Source: Ambulatory Visit | Attending: Family Medicine | Admitting: Family Medicine

## 2015-07-20 ENCOUNTER — Ambulatory Visit (INDEPENDENT_AMBULATORY_CARE_PROVIDER_SITE_OTHER): Payer: Self-pay | Admitting: Family Medicine

## 2015-07-20 ENCOUNTER — Encounter: Payer: Self-pay | Admitting: Family Medicine

## 2015-07-20 VITALS — Temp 98.3°F | Wt 180.5 lb

## 2015-07-20 DIAGNOSIS — M546 Pain in thoracic spine: Secondary | ICD-10-CM

## 2015-07-20 DIAGNOSIS — Z114 Encounter for screening for human immunodeficiency virus [HIV]: Secondary | ICD-10-CM

## 2015-07-20 DIAGNOSIS — M549 Dorsalgia, unspecified: Secondary | ICD-10-CM

## 2015-07-20 DIAGNOSIS — Z113 Encounter for screening for infections with a predominantly sexual mode of transmission: Secondary | ICD-10-CM | POA: Insufficient documentation

## 2015-07-20 DIAGNOSIS — Z202 Contact with and (suspected) exposure to infections with a predominantly sexual mode of transmission: Secondary | ICD-10-CM

## 2015-07-20 DIAGNOSIS — Z20828 Contact with and (suspected) exposure to other viral communicable diseases: Secondary | ICD-10-CM

## 2015-07-20 NOTE — Patient Instructions (Signed)
Proper lifting techniques including for the baby! Lift with your legs not your back.  Work on some range of motion exercises, stretching, trunk twists  Tylenol or ibuprofen over the counter.

## 2015-07-20 NOTE — Progress Notes (Signed)
   Subjective:    Patient ID: Debbie Acevedo, female    DOB: 1995-10-24, 20 y.o.   MRN: [id number]  HPI  CC: back pain  # Back pain:  "having a few back pains" - was told by mom that could be UTI or STD.  Pain is located on both sides of ribs and upper back  Started about 2-3 weeks ago.  Staying the same intensity. It is off and on.  Notices pain when moving in a certain way, or standing for long period of time like at work. Twisting to grab something behind, leaning forward to pick something up  Has not tried taking any medicines, icing or heating pads  Doesn't feel like she is at risk for STD but would like to get tested. Denies any vaginal discharge.  Denies dysuria, frequency, incontinence ROS: no fevers, no chills, no nausea or vomiting   # STD check  Recent new partner, wants to get checked for STDs  No vaginal discharge.  Social Hx: never smoker  Review of Systems   See HPI for ROS.   Past medical history, surgical, family, and social history reviewed and updated in the EMR as appropriate. Objective:  Temp(Src) 98.3 F (36.8 C) (Oral)  Wt 180 lb 8 oz (81.874 kg) Vitals and nursing note reviewed  General: no apparent distress  Back: mildly tender midline/paraspinal lower thoracic area without major muscle spasm, no deformities. Slightly less tender to both sides laterally. ROM is intact in all directions and does not elicit much pain. No CVA tenderness  Assessment & Plan:   1. Upper back pain Suspect MSK from improper lifting technique. Went over this in clinic along with ROM/some basic stretches and exercises. Trial of OTCs analgesics. No red flags in any otherwise healthy G1P1 20yo female. If not improving or worsens return to clinic in next 1-2 months.  2. STD check - HIV antibody - RPR - Urine cytology ancillary only

## 2015-07-21 LAB — HIV ANTIBODY (ROUTINE TESTING W REFLEX): HIV 1&2 Ab, 4th Generation: NONREACTIVE

## 2015-07-21 LAB — RPR

## 2015-07-23 LAB — URINE CYTOLOGY ANCILLARY ONLY
Chlamydia: NEGATIVE
NEISSERIA GONORRHEA: NEGATIVE

## 2015-07-25 ENCOUNTER — Telehealth: Payer: Self-pay | Admitting: Family Medicine

## 2015-07-25 NOTE — Telephone Encounter (Signed)
Called patient and informed her STD testing from last visit was all negative. Patient had no further questions. -Dr. Lamar Benes

## 2015-07-30 ENCOUNTER — Ambulatory Visit: Payer: Medicaid Other | Admitting: Obstetrics and Gynecology

## 2015-07-30 ENCOUNTER — Encounter: Payer: Self-pay | Admitting: *Deleted

## 2015-07-30 DIAGNOSIS — Z975 Presence of (intrauterine) contraceptive device: Secondary | ICD-10-CM | POA: Insufficient documentation

## 2015-10-11 IMAGING — US US OB COMP LESS 14 WK
1 series · 14 of 28 positions shown · non-contrast
Comparison: None.

CLINICAL DATA: Unsure of last menstrual period. Evaluate
gestational age.

EXAM:
OBSTETRIC <14 WK US AND TRANSVAGINAL OB US
TECHNIQUE: Both transabdominal and transvaginal ultrasound examinations were
performed for complete evaluation of the gestation as well as the
maternal uterus, adnexal regions, and pelvic cul-de-sac.
Transvaginal technique was performed to assess early pregnancy.

[Series 1: us ob transvaginal · 14 of 39 slices shown]
[im 2/39]
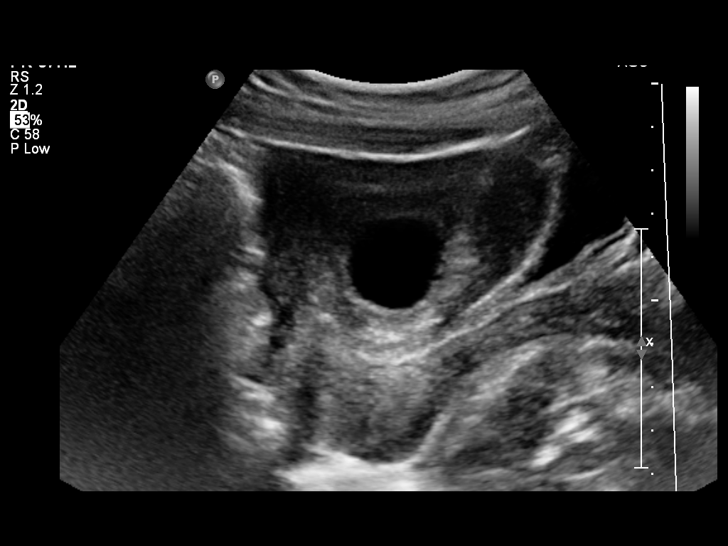
[im 5/39]
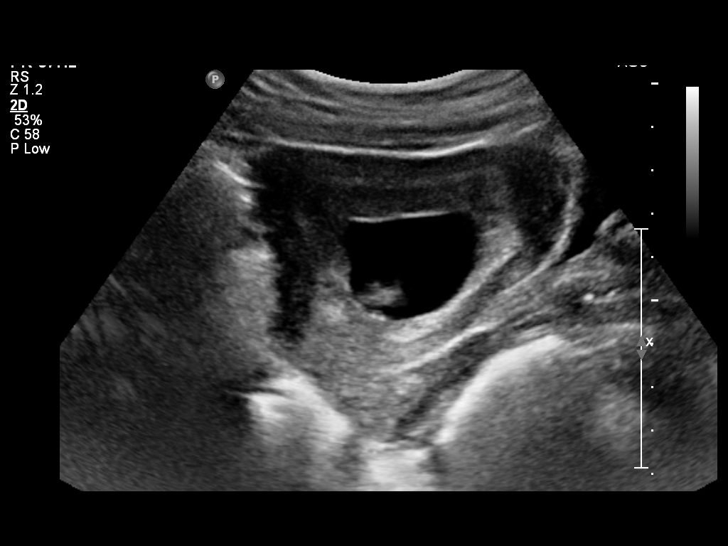
[im 8/39]
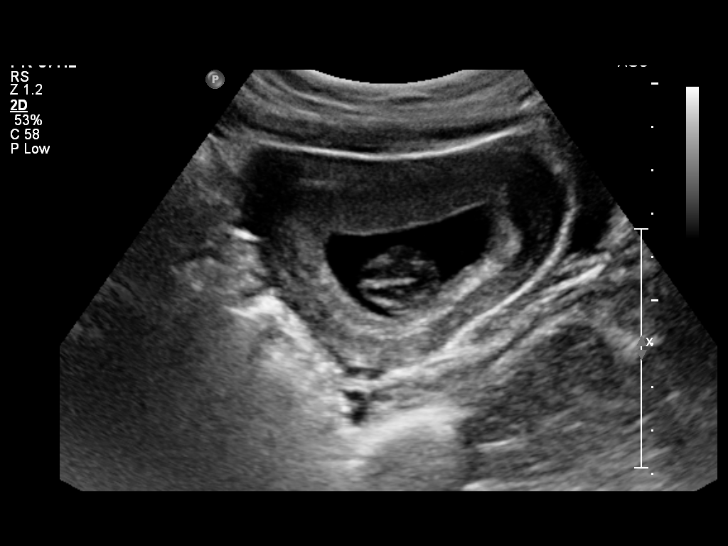
[im 10/39]
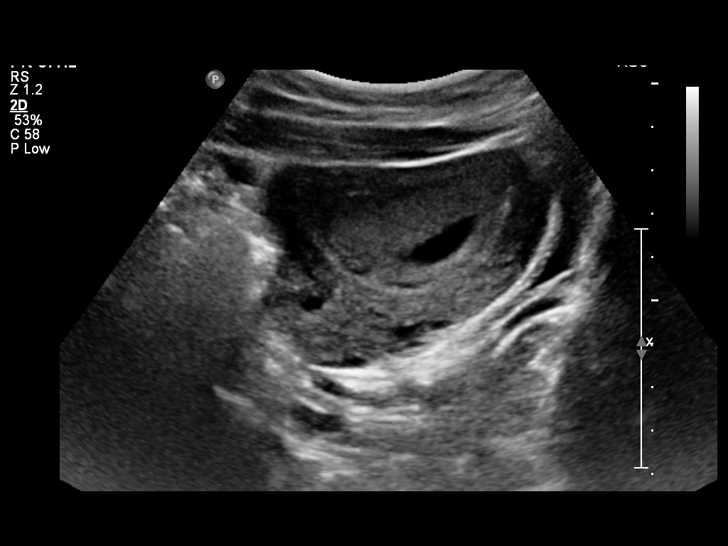
[im 13/39]
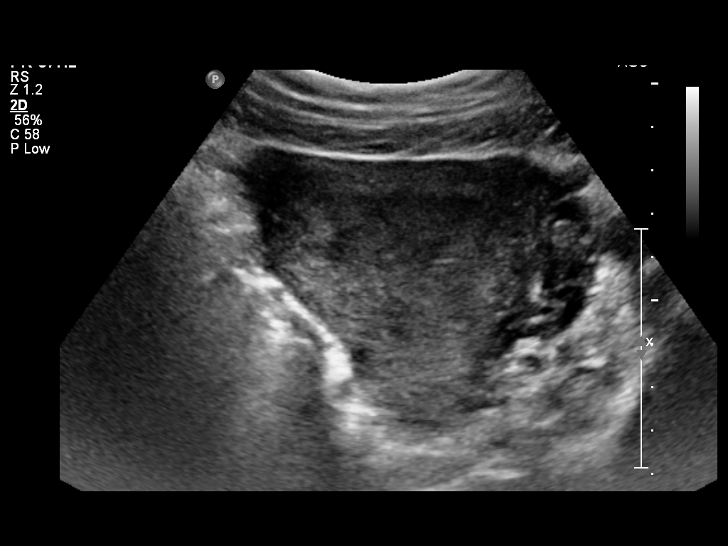
[im 16/39]
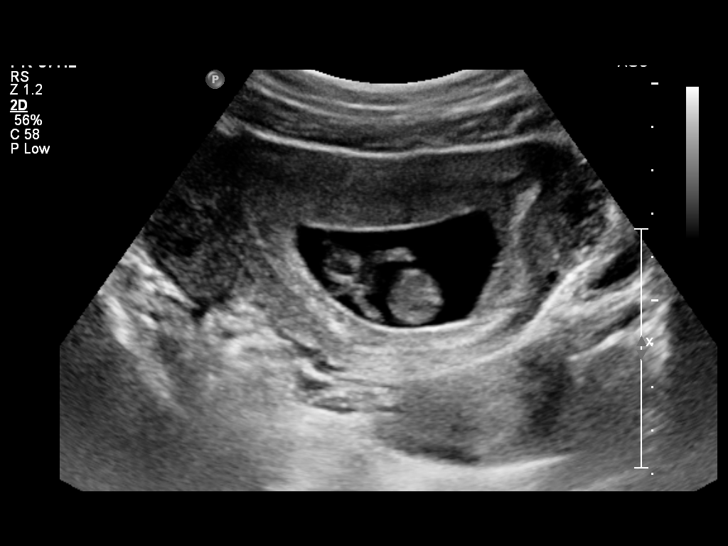
[im 19/39]
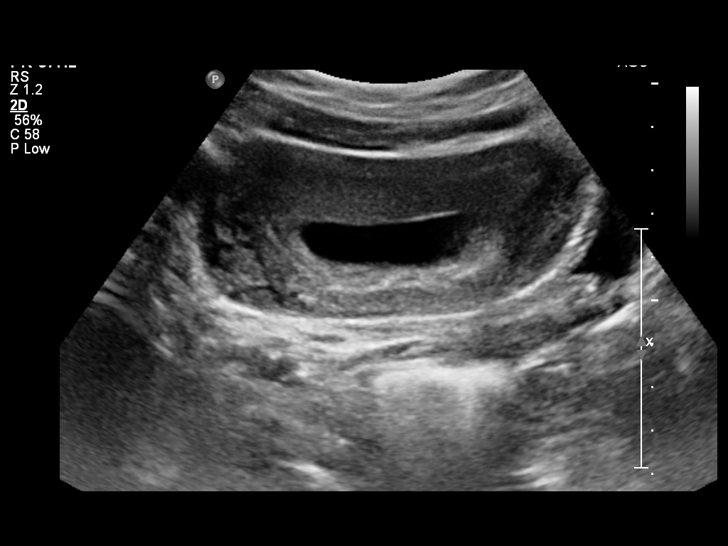
[im 22/39]
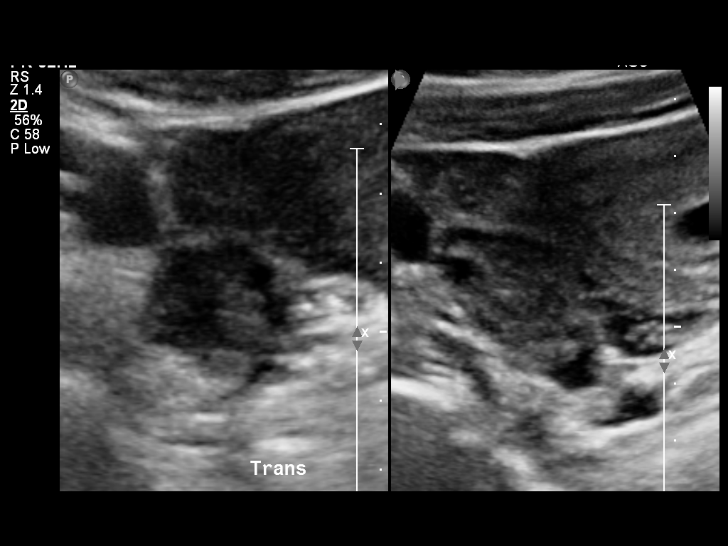
[im 24/39]
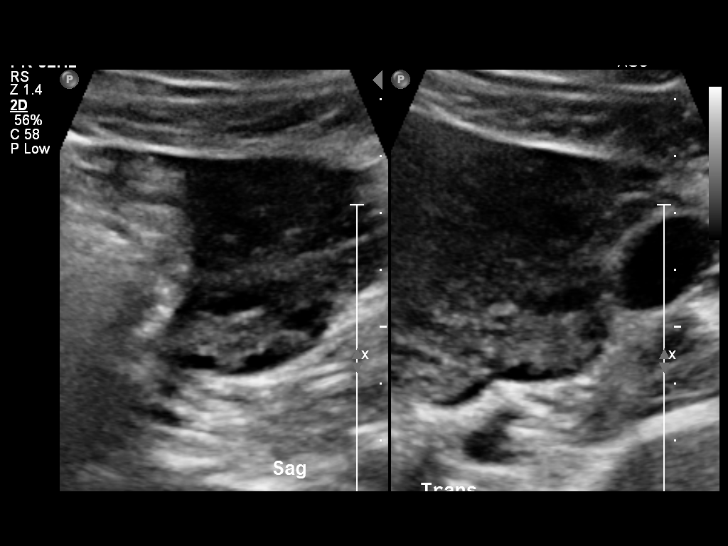
[im 27/39]
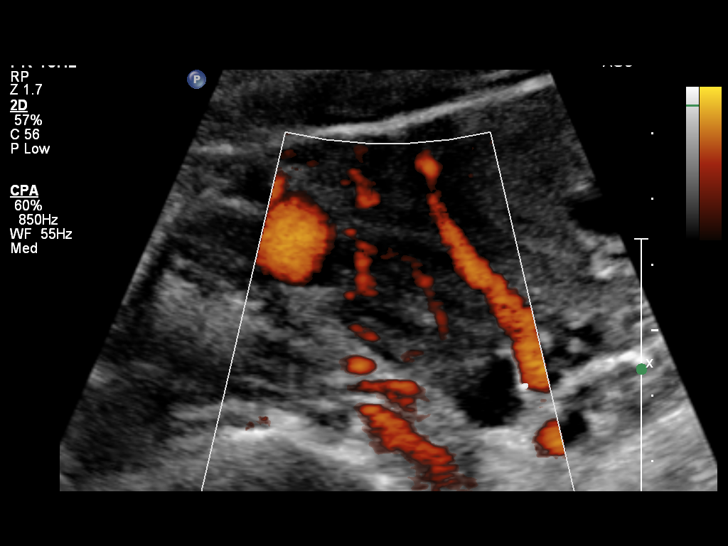
[im 30/39]
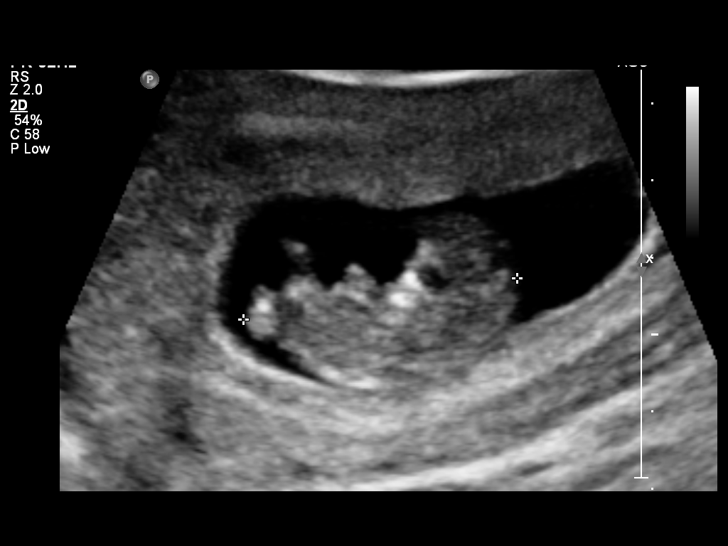
[im 33/39]
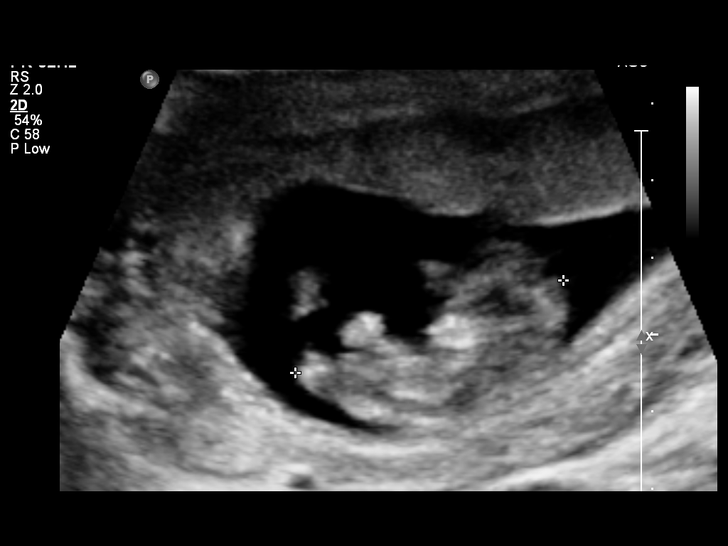
[im 36/39]
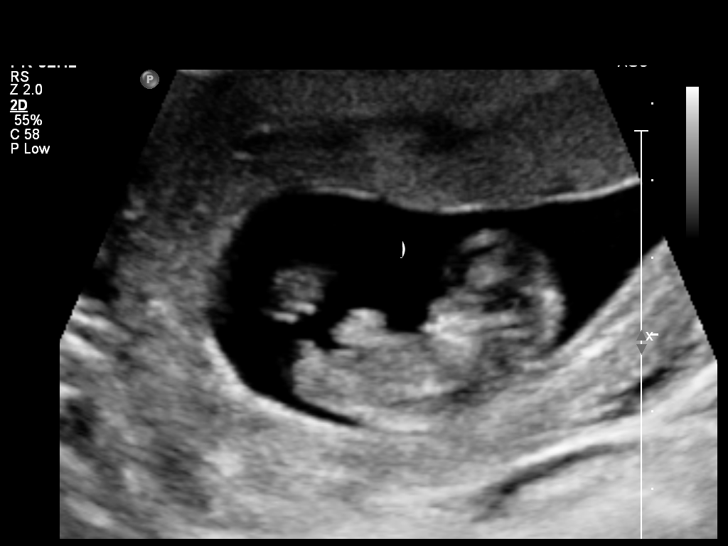
[im 39/39]
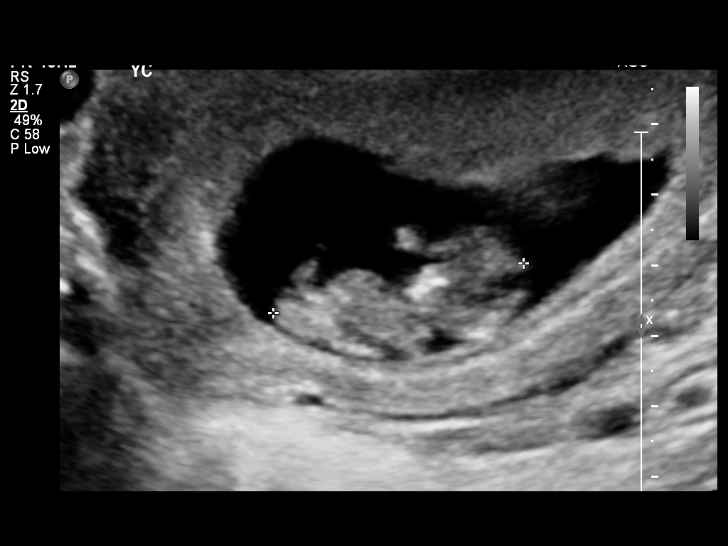

[14 of 28 positions shown; findings below may reference images not displayed]

FINDINGS: Intrauterine gestational sac: Single

Yolk sac:  No

Embryo:  Yes

Cardiac Activity: Yes

Heart Rate:  176 bpm

CRL:   3.6  mm   10 w for d                  US EDC: 01/15/2015

Maternal uterus/adnexae:

Subchorionic hemorrhage: None

Right ovary: Corpus luteum noted.

Left ovary: Normal

Other :None

Free fluid:  None
IMPRESSION: 1. Single living intrauterine gestation has estimated gestational
age of 10 weeks and 4 days.
2. No complications identified.

## 2016-07-28 ENCOUNTER — Encounter: Payer: Self-pay | Admitting: Family Medicine

## 2016-07-28 ENCOUNTER — Ambulatory Visit: Payer: Medicaid Other | Admitting: Family Medicine

## 2016-07-28 ENCOUNTER — Ambulatory Visit (INDEPENDENT_AMBULATORY_CARE_PROVIDER_SITE_OTHER): Payer: Self-pay | Admitting: Family Medicine

## 2016-07-28 ENCOUNTER — Other Ambulatory Visit (HOSPITAL_COMMUNITY)
Admission: RE | Admit: 2016-07-28 | Discharge: 2016-07-28 | Disposition: A | Discharge status: Home / Self Care | Payer: Medicaid Other | Source: Ambulatory Visit | Attending: Family Medicine | Admitting: Family Medicine

## 2016-07-28 VITALS — BP 100/70 | HR 76 | Temp 98.2°F | Wt 184.0 lb

## 2016-07-28 DIAGNOSIS — Z113 Encounter for screening for infections with a predominantly sexual mode of transmission: Secondary | ICD-10-CM | POA: Insufficient documentation

## 2016-07-28 DIAGNOSIS — Z202 Contact with and (suspected) exposure to infections with a predominantly sexual mode of transmission: Secondary | ICD-10-CM

## 2016-07-28 MED ORDER — AZITHROMYCIN 500 MG PO TABS: 500.0000 mg | ORAL_TABLET | Freq: Once | ORAL | Status: DC

## 2016-07-28 MED ORDER — AZITHROMYCIN 500 MG PO TABS
1000.0000 mg | ORAL_TABLET | Freq: Once | ORAL | Status: AC
Start: 2016-07-28 — End: 2016-07-28
  Administered 2016-07-28: 1000 mg via ORAL
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
## 2016-07-28 NOTE — Addendum Note (Signed)
Addended by: Dorna Bloom on: 07/28/2016 02:56 PM   Modules accepted: Orders

## 2016-07-28 NOTE — Patient Instructions (Signed)
We will treat you today.  We will call you with the results of your tests.  Take care,  Dr Jerline Pain

## 2016-07-29 LAB — RPR

## 2016-07-29 LAB — URINE CYTOLOGY ANCILLARY ONLY
CHLAMYDIA, DNA PROBE: POSITIVE — AB
NEISSERIA GONORRHEA: NEGATIVE
Trichomonas: NEGATIVE

## 2016-07-29 LAB — HIV ANTIBODY (ROUTINE TESTING W REFLEX): HIV: NONREACTIVE

## 2016-07-30 ENCOUNTER — Telehealth: Payer: Self-pay | Admitting: Family Medicine

## 2016-07-30 NOTE — Telephone Encounter (Signed)
Called pt with no answer and unable to leave VM.

## 2016-07-30 NOTE — Telephone Encounter (Signed)
Called patient to discuss lab results. No answer. Left VM with call back number.  Lab significant for positive chlamydia - patient knew she was exposed to this. The rest of her STD screening was negative. She was treated with azithromycin during her office visit and should not require any further treatment.  Algis Greenhouse. Jerline Pain, [place] [facility] Resident PGY-3 07/30/2016 8:49 AM

## 2017-11-27 ENCOUNTER — Other Ambulatory Visit: Payer: Self-pay

## 2017-11-27 ENCOUNTER — Encounter (HOSPITAL_BASED_OUTPATIENT_CLINIC_OR_DEPARTMENT_OTHER): Payer: Self-pay

## 2017-11-27 ENCOUNTER — Emergency Department (HOSPITAL_BASED_OUTPATIENT_CLINIC_OR_DEPARTMENT_OTHER)
Admission: EM | Admit: 2017-11-27 | Discharge: 2017-11-27 | Discharge status: Against Medical Advice | Payer: Self-pay | Attending: Emergency Medicine | Admitting: Emergency Medicine

## 2017-11-27 DIAGNOSIS — F410 Panic disorder [episodic paroxysmal anxiety] without agoraphobia: Secondary | ICD-10-CM | POA: Insufficient documentation

## 2017-11-27 DIAGNOSIS — Z5321 Procedure and treatment not carried out due to patient leaving prior to being seen by health care provider: Secondary | ICD-10-CM | POA: Insufficient documentation
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
## 2017-11-27 NOTE — ED Triage Notes (Signed)
EMS reported pt had a panic attack PTA-states she just feels "numb all over"-NAD-to triage in w/c

## 2018-03-11 NOTE — Progress Notes (Deleted)
  Subjective:   Patient ID: Debbie Acevedo    DOB: 1995-06-11, 22 y.o. female   MRN: [id number]  Debbie Acevedo is a 22 y.o. female with a history of hidradenitis suppurativa here for   Contraception - Nexplanon placed 01/17/2015 in L arm.   ***needs appt for pap smear  Review of Systems:  Per HPI.  [place], medications and smoking status reviewed.  Objective:   There were no vitals taken for this visit. Vitals and nursing note reviewed.  General: well nourished, well developed, in no acute distress with non-toxic appearance HEENT: normocephalic, atraumatic, moist mucous membranes Neck: supple, non-tender without lymphadenopathy CV: regular rate and rhythm without murmurs, rubs, or gallops, no lower extremity edema Lungs: clear to auscultation bilaterally with normal work of breathing Abdomen: soft, non-tender, non-distended, no masses or organomegaly palpable, normoactive bowel sounds Skin: warm, dry, no rashes or lesions Extremities: warm and well perfused, normal tone MSK: ROM grossly intact, strength intact, gait normal Neuro: Alert and oriented, speech normal  Assessment & Plan:   No problem-specific Assessment & Plan notes found for this encounter.  No orders of the defined types were placed in this encounter.  No orders of the defined types were placed in this encounter.   Rory Percy, DO PGY-2, [facility] 03/11/2018 7:21 PM

## 2018-03-12 ENCOUNTER — Ambulatory Visit: Payer: Self-pay | Admitting: Family Medicine

## 2018-08-16 NOTE — Progress Notes (Deleted)
  Subjective:   Patient ID: Debbie Acevedo    DOB: December 17, 1995, 23 y.o. female   MRN: [id number]  Debbie Acevedo is a 23 y.o. female with a history of hidradenitis suppurativa here for   Contraception - Nexplanon placed 01/2015 in L arm. - Patient *** sexually active. - Menstruation *** - STI?  *** due for pap  Review of Systems:  Per HPI.  [place], medications and smoking status reviewed.  Objective:   There were no vitals taken for this visit. Vitals and nursing note reviewed.  General: well nourished, well developed, in no acute distress with non-toxic appearance HEENT: normocephalic, atraumatic, moist mucous membranes Neck: supple, non-tender without lymphadenopathy CV: regular rate and rhythm without murmurs, rubs, or gallops, no lower extremity edema Lungs: clear to auscultation bilaterally with normal work of breathing Abdomen: soft, non-tender, non-distended, no masses or organomegaly palpable, normoactive bowel sounds Skin: warm, dry, no rashes or lesions Extremities: warm and well perfused, normal tone MSK: ROM grossly intact, strength intact, gait normal Neuro: Alert and oriented, speech normal  Assessment & Plan:   No problem-specific Assessment & Plan notes found for this encounter.  No orders of the defined types were placed in this encounter.  No orders of the defined types were placed in this encounter.   Rory Percy, DO PGY-2, [place] [facility] 08/16/2018 6:46 PM

## 2018-08-17 ENCOUNTER — Ambulatory Visit: Payer: Self-pay | Admitting: Family Medicine

## 2018-09-14 ENCOUNTER — Ambulatory Visit: Payer: Self-pay | Admitting: Family Medicine

## 2018-10-11 NOTE — Progress Notes (Signed)
  Subjective:   Patient ID: Debbie Acevedo    DOB: [birth year], 23 y.o. female   MRN: [id number]  Debbie Acevedo is a 23 y.o. female with a history of hidradenitis suppurativa, biliary colic, ganglion cyst of wrist here for   Contraception management - nexplanon placed 01/2015 in L arm - endorses regular periods while on Nexplanon, missed period in April, LMP 08/22/18 - Stated at the beginning of April she felt nauseated, although not ongoing. No vomiting. - was having some cramping, but not period cramps. - sexually active, no other protection.  Review of Systems:  Per HPI.  [place], medications and smoking status reviewed.  Objective:   BP 110/70   Pulse 85   Temp 98.3 F (36.8 C) (Oral)   Wt 215 lb (97.5 kg)   LMP 08/22/2018   SpO2 98%   BMI 34.70 kg/m  Vitals and nursing note reviewed.  General: obese female, in no acute distress with non-toxic appearance Skin: warm, dry, no rashes or lesions. Palpable nexplanon in L upper arm. Extremities: warm and well perfused, normal tone MSK: ROM grossly intact, strength intact, gait normal Neuro: Alert and oriented, speech normal  Risks & benefits of Nexplanon removal discussed. Consent form signed.  The patient denies any allergies to anesthetics or antiseptics.  Procedure: Pt was placed in supine position. left arm was flexed at the elbow and externally rotated so that her wrist was parallel to her ear, The device was palpated and marked. The site was cleaned with Betadine. The area surrounding the device was covered with a sterile drape. 1% lidocaine was injected just under the device. A scalpel was used to create a small incision. The device was pushed towards the incision. Fibrous tissue surrounding the device was gradually removed from the device. The device was removed and measured to ensure all 4 cm of device was removed. Steri-strips were used to close the incision. Pressure dressing was applied to the patient.   The patient was instructed to removed the pressure dressing in 48 hrs. The patient was advised to move slowly from a supine to an upright position The patient denied any concerns or complaints  Assessment & Plan:   Birth control counseling Removed nexplanon without immediate complications (see procedure note above). Instructed to call with signs of infection (see AVS). Patient desires alternative form of contraception but is undecided, handout provided and counseling provided during visit. Instructed patient to come back for pap smear once she decides which contraception option she prefers. Instructed to use condoms until then. UPreg negative.  Orders Placed This Encounter  Procedures  . POCT urine pregnancy   No orders of the defined types were placed in this encounter.   Rory Percy, DO PGY-2, [facility] 10/12/2018 1:33 PM

## 2018-10-12 ENCOUNTER — Other Ambulatory Visit: Payer: Self-pay

## 2018-10-12 ENCOUNTER — Encounter: Payer: Self-pay | Admitting: Family Medicine

## 2018-10-12 ENCOUNTER — Ambulatory Visit (INDEPENDENT_AMBULATORY_CARE_PROVIDER_SITE_OTHER): Payer: Self-pay | Admitting: Family Medicine

## 2018-10-12 VITALS — BP 110/70 | HR 85 | Temp 98.3°F | Wt 215.0 lb

## 2018-10-12 DIAGNOSIS — Z975 Presence of (intrauterine) contraceptive device: Secondary | ICD-10-CM

## 2018-10-12 DIAGNOSIS — Z3009 Encounter for other general counseling and advice on contraception: Secondary | ICD-10-CM | POA: Insufficient documentation

## 2018-10-12 LAB — POCT URINE PREGNANCY: Preg Test, Ur: NEGATIVE

## 2018-10-12 NOTE — Assessment & Plan Note (Addendum)
Removed nexplanon without immediate complications (see procedure note above). Instructed to call with signs of infection (see AVS). Patient desires alternative form of contraception but is undecided, handout provided and counseling provided during visit. Instructed patient to come back for pap smear once she decides which contraception option she prefers. Instructed to use condoms until then. UPreg negative.

## 2018-10-12 NOTE — Patient Instructions (Signed)
It was great to see you!  Our plans for today:  - We removed your nexplanon today. You should use condoms every time you have sex to prevent pregnancy and STIs. Review the birth control handout for other options of birth control. When you have decided which option to go with, make an appointment to discuss this and your pap smear. - Let your steri-strips stay on until they fall off, about a couple of days. It's ok to take tylenol and ibuprofen for pain. - If you notice your arm become red, excessively tender, swollen, or have discharge from the site, call us.  Take care and seek immediate care sooner if you develop any concerns.   Dr. [facility]

## 2018-12-16 ENCOUNTER — Encounter: Payer: Self-pay | Admitting: Family Medicine

## 2018-12-16 ENCOUNTER — Other Ambulatory Visit: Payer: Self-pay

## 2018-12-16 ENCOUNTER — Ambulatory Visit (INDEPENDENT_AMBULATORY_CARE_PROVIDER_SITE_OTHER): Payer: Medicaid Other | Admitting: Family Medicine

## 2018-12-16 VITALS — BP 98/62 | HR 99

## 2018-12-16 DIAGNOSIS — Z3490 Encounter for supervision of normal pregnancy, unspecified, unspecified trimester: Secondary | ICD-10-CM

## 2018-12-16 DIAGNOSIS — Z3201 Encounter for pregnancy test, result positive: Secondary | ICD-10-CM | POA: Diagnosis not present

## 2018-12-16 DIAGNOSIS — N912 Amenorrhea, unspecified: Secondary | ICD-10-CM | POA: Diagnosis present

## 2018-12-16 LAB — POCT URINE PREGNANCY: Preg Test, Ur: POSITIVE — AB

## 2018-12-16 MED ORDER — DOXYLAMINE-PYRIDOXINE 10-10 MG PO TBEC: 1.0000 | DELAYED_RELEASE_TABLET | Freq: Two times a day (BID) | ORAL | 1 refills | Status: DC

## 2018-12-16 NOTE — Progress Notes (Signed)
    Subjective:  Debbie Acevedo is a 23 y.o. female who presents to the [facility] today with a chief complaint of positive home pregnancy test.   HPI: New pregnancy, initial encounter She reports that her last menstrual period started on 2/02 (she is certain, she was tracking).  She reports that since that time, she has not experienced any vaginal bleeding and has experienced increased nausea throughout the day.  She suspected that she may be pregnant as she is sexually active without birth control.  Home pregnancy test performed in the past week was positive.  She came in today to verify the home pregnancy test.  Urine pregnancy test performed in clinic this positive.  She reports that this is an unintended pregnancy and she is planning to keep the pregnancy.  She is currently taking prenatal vitamins, she takes no other medication.  She denies smoking, drinking, cocaine, heroin.  She reports that she has a stable, safe home situation.  She lives with her daughter at home and is frequently helped by her own mother who lives in [place].  Chief Complaint noted Review of Symptoms - see HPI PMH -non-smoker, does not drink, no medication use apart from prenatal vitamins.  Objective:  Physical Exam: BP 98/62   Pulse 99   SpO2 99%    Gen: NAD, resting comfortably in exam room CV: RRR with no murmurs appreciated Pulm: NWOB, CTAB with no crackles, wheezes, or rhonchi GI: Normal bowel sounds present. Soft, Nontender, Nondistended. MSK: no edema, cyanosis, or clubbing noted Skin: warm, dry  Results for orders placed or performed in visit on 12/16/18 (from the past 72 hour(s))  POCT urine pregnancy     Status: Abnormal   Collection Time: 12/16/18 10:40 AM  Result Value Ref Range   Preg Test, Ur Positive (A) Negative     Assessment/Plan:  Pregnancy at early stage G2P1001 presents at 7wk 3days from last menstrual period with a positive pregnancy test.  She wants to keep the pregnancy. -No  dating ultrasound, certain of LMP -Follow-up initial pregnancy labs -Initial OB visit in roughly 1 week -Continue prenatal vitamins -Diclegis for nausea -Precautions given regarding when to present to MAU -Monitor blood pressure

## 2018-12-16 NOTE — Progress Notes (Signed)
uri

## 2018-12-16 NOTE — Assessment & Plan Note (Addendum)
G2P1001 presents at 7wk 3days from last menstrual period with a positive pregnancy test.  She wants to keep the pregnancy. -No dating ultrasound, certain of LMP -Follow-up initial pregnancy labs -Initial OB visit in roughly 1 week -Continue prenatal vitamins -Diclegis for nausea -Precautions given regarding when to present to MAU -Monitor blood pressure

## 2018-12-16 NOTE — Patient Instructions (Addendum)
Congratulations on you pregnancy!  Please follow up in clinic for your first official prenatal visit.  In the meantime, begin taking the Diclegis for you nausea.  Follow the instructions on the bottle.   We will keep an eye on your blood pressure.  Continue taking your prenatals.

## 2018-12-17 LAB — HGB FRAC. W/SOLUBILITY
Hgb A2 Quant: 2.4 % (ref 1.8–3.2)
Hgb A: 97.6 % (ref 96.4–98.8)
Hgb C: 0 %
Hgb F Quant: 0 % (ref 0.0–2.0)
Hgb S: 0 %
Hgb Solubility: NEGATIVE
Hgb Variant: 0 %

## 2018-12-17 LAB — OBSTETRIC PANEL, INCLUDING HIV
Antibody Screen: NEGATIVE
Basophils Absolute: 0 10*3/uL (ref 0.0–0.2)
Basos: 0 %
EOS (ABSOLUTE): 0 10*3/uL (ref 0.0–0.4)
Eos: 0 %
HIV Screen 4th Generation wRfx: NONREACTIVE
Hematocrit: 41.4 % (ref 34.0–46.6)
Hemoglobin: 13.6 g/dL (ref 11.1–15.9)
Hepatitis B Surface Ag: NEGATIVE
Immature Grans (Abs): 0 10*3/uL (ref 0.0–0.1)
Immature Granulocytes: 0 %
Lymphocytes Absolute: 1.1 10*3/uL (ref 0.7–3.1)
Lymphs: 9 %
MCH: 29.6 pg (ref 26.6–33.0)
MCHC: 32.9 g/dL (ref 31.5–35.7)
MCV: 90 fL (ref 79–97)
Monocytes Absolute: 0.6 10*3/uL (ref 0.1–0.9)
Monocytes: 5 %
Neutrophils Absolute: 10 10*3/uL — ABNORMAL HIGH (ref 1.4–7.0)
Neutrophils: 86 %
Platelets: 336 10*3/uL (ref 150–450)
RBC: 4.59 x10E6/uL (ref 3.77–5.28)
RDW: 13.9 % (ref 11.7–15.4)
RPR Ser Ql: NONREACTIVE
Rh Factor: NEGATIVE
Rubella Antibodies, IGG: 1.53 index (ref >0.99)
WBC: 11.8 10*3/uL — ABNORMAL HIGH (ref 3.4–10.8)

## 2018-12-19 LAB — URINE CULTURE, OB REFLEX

## 2018-12-19 LAB — CULTURE, OB URINE

## 2018-12-20 ENCOUNTER — Other Ambulatory Visit: Payer: Self-pay | Admitting: Family Medicine

## 2018-12-20 DIAGNOSIS — O99891 Other specified diseases and conditions complicating pregnancy: Secondary | ICD-10-CM

## 2018-12-20 DIAGNOSIS — R8271 Bacteriuria: Secondary | ICD-10-CM

## 2018-12-20 MED ORDER — AMOXICILLIN-POT CLAVULANATE 875-125 MG PO TABS: 1.0000 | ORAL_TABLET | Freq: Two times a day (BID) | ORAL | 0 refills | Status: AC

## 2018-12-20 NOTE — Progress Notes (Signed)
Pt informed of positive urine culture and antibiotic treatment.  Augmentin sent to pharmacy.  Questions answered. Will plan for TOC at subsequent prenatal visit.  Matilde Haymaker, MD

## 2018-12-24 ENCOUNTER — Ambulatory Visit (INDEPENDENT_AMBULATORY_CARE_PROVIDER_SITE_OTHER): Payer: Self-pay | Admitting: Family Medicine

## 2018-12-24 ENCOUNTER — Encounter: Payer: Self-pay | Admitting: Family Medicine

## 2018-12-24 ENCOUNTER — Other Ambulatory Visit (HOSPITAL_COMMUNITY)
Admission: RE | Admit: 2018-12-24 | Discharge: 2018-12-24 | Disposition: A | Discharge status: Home / Self Care | Payer: Medicaid Other | Source: Ambulatory Visit | Attending: Family Medicine | Admitting: Family Medicine

## 2018-12-24 ENCOUNTER — Other Ambulatory Visit: Payer: Self-pay

## 2018-12-24 VITALS — BP 100/60 | HR 92 | Wt 213.0 lb

## 2018-12-24 DIAGNOSIS — Z3401 Encounter for supervision of normal first pregnancy, first trimester: Secondary | ICD-10-CM

## 2018-12-24 DIAGNOSIS — O26899 Other specified pregnancy related conditions, unspecified trimester: Secondary | ICD-10-CM | POA: Insufficient documentation

## 2018-12-24 DIAGNOSIS — Z6791 Unspecified blood type, Rh negative: Secondary | ICD-10-CM | POA: Insufficient documentation

## 2018-12-24 DIAGNOSIS — Z3490 Encounter for supervision of normal pregnancy, unspecified, unspecified trimester: Secondary | ICD-10-CM

## 2018-12-24 NOTE — Progress Notes (Signed)
Debbie Acevedo is a 23 y.o. yo G2P1001 at [redacted]w[redacted]d who presents for her initial prenatal visit.  She has been experiencing mild morning sickness which is been moderately well controlled with ginger snaps.  She has not yet been able to pick up the Diclegis at her pharmacy for insurance reasons.  She has not experienced contractions, abdominal/pelvic pain, vaginal bleeding.  She reports that she has been mildly anxious about her high blood pressure and mild lower extremity swelling. Pregnancy is not planned She reports positive home pregnancy test. She  is taking PNV. See flow sheet for details.  PMH, POBH, FH, meds, allergies and Social Hx reviewed.  Prenatal Exam: Gen: Well nourished, well developed.  No distress.  Vitals noted. HEENT: Normocephalic, atraumatic.  Neck supple without cervical lymphadenopathy, thyromegaly or thyroid nodules.  Fair dentition. CV: RRR no murmur, gallops or rubs Lungs: CTAB.  Normal respiratory effort without wheezes or rales. Abd: soft, NTND. +BS.  Uterus not appreciated above pelvis. GU: Normal external female genitalia without lesions.  Normal vaginal, well rugated without lesions. No vaginal discharge.  Bimanual exam: No adnexal mass or TTP.  Ext: No clubbing, cyanosis or edema. Psych: Normal grooming and dress.  Not depressed or anxious appearing.  Normal thought content and process without flight of ideas or looseness of associations.  Assessment & Plan: 1) 23 y.o. yo G2P1001 at [redacted]w[redacted]d via LMP doing well.   Rh negative, antibody negative -rescreen at 28 weeks  Asymptomatic bacteriuria -Currently taking Augmentin -Require a test of cure at next visit  Risk factors for diabetes (African-American, obesity) -1 hour Glucola at next visit  Routine prenatal care Dating is questionable.  We will follow-up with first trimester ultrasound. Genetic screening offered: Declined. PHQ-9 and Pregnancy Medical Home forms to be completed at next visit  Bleeding and  pain precautions reviewed. Importance of prenatal vitamins reviewed.  Follow up in 4 weeks.

## 2018-12-24 NOTE — Patient Instructions (Signed)
You are looking great so far!!!  In order to verify dates for your delivery, let's go ahead and get a dating ultrasound (it is the standard of care when there is any doubt about the date of conception).  Other than that, you are all set for today.  We will to the diabetes screening during your next visit in four weeks.

## 2018-12-27 ENCOUNTER — Ambulatory Visit: Payer: Self-pay | Admitting: Family Medicine

## 2018-12-27 LAB — CYTOLOGY - PAP
Adequacy: ABSENT
Diagnosis: NEGATIVE

## 2018-12-29 ENCOUNTER — Encounter: Payer: Self-pay | Admitting: Family Medicine

## 2018-12-31 LAB — CERVICOVAGINAL ANCILLARY ONLY
Chlamydia: NEGATIVE
Neisseria Gonorrhea: NEGATIVE

## 2019-01-05 ENCOUNTER — Ambulatory Visit (HOSPITAL_COMMUNITY)
Admission: RE | Admit: 2019-01-05 | Discharge: 2019-01-05 | Disposition: A | Discharge status: Home / Self Care | Payer: Medicaid Other | Source: Ambulatory Visit | Attending: Family Medicine | Admitting: Family Medicine

## 2019-01-05 ENCOUNTER — Other Ambulatory Visit: Payer: Self-pay

## 2019-01-05 DIAGNOSIS — Z3490 Encounter for supervision of normal pregnancy, unspecified, unspecified trimester: Secondary | ICD-10-CM | POA: Diagnosis present

## 2019-01-21 ENCOUNTER — Telehealth: Payer: Self-pay

## 2019-01-21 NOTE — Telephone Encounter (Signed)
Patient calls nurse line stating she has really bad anxiety taking pills. Patient stated the antibiotic given to her for UTI, she can not take. Patient stated she has tried crushing up in smoothies, however this did not work. Patient is wondering if a smaller antibiotic can be called in, or liquid form. Please advise.

## 2019-01-25 NOTE — Telephone Encounter (Signed)
Can we please call Ms. Kulinski back and clarify the pills that she is taking?  She should have finished her antibiotics on 7/22. She should not still be taking them. Is she referring to her prenatal vitamins?  If so, we may be able to find different prenatal vitamins. Although, I am in favor of her learning to take pills because it is a good skill to have.  Matilde Haymaker, MD

## 2019-01-27 ENCOUNTER — Ambulatory Visit (INDEPENDENT_AMBULATORY_CARE_PROVIDER_SITE_OTHER): Payer: Medicaid Other | Admitting: Family Medicine

## 2019-01-27 ENCOUNTER — Other Ambulatory Visit: Payer: Self-pay

## 2019-01-27 VITALS — BP 110/70 | HR 90 | Wt 205.1 lb

## 2019-01-27 DIAGNOSIS — Z3490 Encounter for supervision of normal pregnancy, unspecified, unspecified trimester: Secondary | ICD-10-CM | POA: Diagnosis not present

## 2019-01-27 DIAGNOSIS — R8271 Bacteriuria: Secondary | ICD-10-CM

## 2019-01-27 LAB — POCT URINALYSIS DIP (MANUAL ENTRY)
Bilirubin, UA: NEGATIVE
Blood, UA: NEGATIVE
Glucose, UA: NEGATIVE mg/dL
Ketones, POC UA: NEGATIVE mg/dL
Nitrite, UA: NEGATIVE
Protein Ur, POC: NEGATIVE mg/dL
Spec Grav, UA: 1.02 (ref 1.010–1.025)
Urobilinogen, UA: 2 E.U./dL — AB
pH, UA: 7.5 (ref 5.0–8.0)

## 2019-01-27 LAB — POCT 1 HR PRENATAL GLUCOSE: Glucose 1 Hr Prenatal, POC: 119 mg/dL

## 2019-01-27 LAB — POCT UA - MICROSCOPIC ONLY: Epithelial cells, urine per micros: >20

## 2019-01-27 MED ORDER — AMOXICILLIN-POT CLAVULANATE 250-62.5 MG/5ML PO SUSR: 500.0000 mg | Freq: Two times a day (BID) | ORAL | 0 refills | Status: AC

## 2019-01-27 NOTE — Telephone Encounter (Signed)
Attempted to call pt. Went straight to VM. I left a message for the pt to call back. Clarifying what medications she is actually taking right now.  Salvatore Marvel, CMA

## 2019-01-27 NOTE — Progress Notes (Addendum)
Debbie Acevedo is a 23 y.o. G2P1001 at [redacted]w[redacted]d here for routine follow up.  She reports backache, nausea, no leaking and occasional contractions. See flow sheet for details.  She is reported several days in the past week where she has had 1-2 contractions lasting 30 seconds to 1 minute.  She denies vaginal bleeding, discharge.  General: Alert and cooperative and appears to be in no acute distress HEENT: Neck non-tender without lymphadenopathy, masses or thyromegaly Cardio: Normal S1 and S2, no S3 or S4. Rhythm is regular. No murmurs or rubs.   Pulm: Clear to auscultation bilaterally, no crackles, wheezing, or diminished breath sounds. Normal respiratory effort Abdomen: Bowel sounds normal. Abdomen soft and non-tender.  Extremities: No peripheral edema. Warm/ well perfused.  Strong radial and pedal pulses. Neuro: Cranial nerves grossly intact   A/P: Pregnancy at [redacted]w[redacted]d.  Doing well.   Pregnancy issues include:  Rh-, antibody negative -Rescreen at 28 weeks  Asymptomatic bacteriuria Due to her difficulty swallowing pills, she did not complete her course of Augmentin.  She completed 3 days.  In clinic today UA continues to show leukocytes. -5-day course of liquid Augmentin prescribed -Follow-up TOC next visit  Nausea Prescribed Diclegis at previous visit.  Unable to pick up prescription due to lack of Medicaid at that time.  She has since required Medicaid. -Diclegis  Routine prenatal care 1 hour GTT 119 - negative PHQ 9 total score of 9 today in clinic-negative. -Anatomy ultrasound to be ordered at next visit. -Patient is not interested in genetic screening. -Bleeding and pain precautions reviewed. -Follow up 4 weeks.

## 2019-01-27 NOTE — Telephone Encounter (Signed)
Attempted for a 2nd time to reach pt. Again it went straight to voice mail. Again I left a message for pt to call the office to verify what medication she is currently taking. Salvatore Marvel, CMA

## 2019-01-27 NOTE — Patient Instructions (Addendum)
Asymptomatic UTI: He looks like he still have an infection in your urine.  I have prescribed you the same antibiotic but in liquid form.  Please complete a 5-day course of this and we will recheck her urine at your next visit.  Nausea: Diclegis, the medication to help with nausea, has already been sent into pharmacy.  If that ends up being expensive, you can simply buy the B6 over-the-counter.  The most appropriate dose of B6 for nausea in pregnancy is 10 to 20 mg as often as 3 times daily.

## 2019-01-28 ENCOUNTER — Encounter: Payer: Self-pay | Admitting: Family Medicine

## 2019-01-28 NOTE — Telephone Encounter (Signed)
Spoke with pt. She stated that she had an appt with Dr. Pilar Plate 01/27/2019 and that everything is good now. Salvatore Marvel, CMA

## 2019-01-30 LAB — URINE CULTURE, OB REFLEX

## 2019-01-30 LAB — CULTURE, OB URINE

## 2019-02-24 ENCOUNTER — Encounter: Payer: Medicaid Other | Admitting: Family Medicine

## 2019-02-24 NOTE — Progress Notes (Deleted)
Debbie Acevedo is a 23 y.o. G2P1001 at [redacted]w[redacted]d here for routine follow up.  She reports {symptoms; pregnancy related:14538}. See flow sheet for details.  General: Alert and cooperative and appears to be in no acute distress HEENT: Neck non-tender without lymphadenopathy, masses or thyromegaly Cardio: Normal S1 and S2, no S3 or S4. Rhythm is regular***. No murmurs or rubs.   Pulm: Clear to auscultation bilaterally, no crackles, wheezing, or diminished breath sounds. Normal respiratory effort Abdomen: Bowel sounds normal. Abdomen soft and non-tender.  Extremities: No peripheral edema. Warm/ well perfused.  Strong radial and pedal pulses***. Neuro: Cranial nerves grossly intact  A/P: Pregnancy at [redacted]w[redacted]d.  Doing well.    Rh-, antibody negative -Rescreen at 28 weeks  Asymptomatic bacteriuria  Routine prenatal care -Anatomy ultrasound ordered to be scheduled at 18-19 weeks. -Patient {is/is not:9024} interested in genetic screening. -Bleeding and pain precautions reviewed. -Follow up 4 weeks.

## 2019-02-27 ENCOUNTER — Other Ambulatory Visit: Payer: Self-pay

## 2019-02-27 ENCOUNTER — Inpatient Hospital Stay (HOSPITAL_COMMUNITY)
Admission: AD | Admit: 2019-02-27 | Discharge: 2019-02-27 | Disposition: A | Discharge status: Home / Self Care | Payer: Medicaid Other | Attending: Obstetrics and Gynecology | Admitting: Obstetrics and Gynecology

## 2019-02-27 DIAGNOSIS — Z3A17 17 weeks gestation of pregnancy: Secondary | ICD-10-CM | POA: Insufficient documentation

## 2019-02-27 DIAGNOSIS — O26892 Other specified pregnancy related conditions, second trimester: Secondary | ICD-10-CM | POA: Diagnosis not present

## 2019-02-27 DIAGNOSIS — O9989 Other specified diseases and conditions complicating pregnancy, childbirth and the puerperium: Secondary | ICD-10-CM | POA: Insufficient documentation

## 2019-02-27 DIAGNOSIS — Z809 Family history of malignant neoplasm, unspecified: Secondary | ICD-10-CM | POA: Diagnosis not present

## 2019-02-27 DIAGNOSIS — Z9049 Acquired absence of other specified parts of digestive tract: Secondary | ICD-10-CM | POA: Insufficient documentation

## 2019-02-27 DIAGNOSIS — M5416 Radiculopathy, lumbar region: Secondary | ICD-10-CM | POA: Insufficient documentation

## 2019-02-27 LAB — URINALYSIS, ROUTINE W REFLEX MICROSCOPIC
Bilirubin Urine: NEGATIVE
Glucose, UA: NEGATIVE mg/dL
Hgb urine dipstick: NEGATIVE
Ketones, ur: NEGATIVE mg/dL
Leukocytes,Ua: NEGATIVE
Nitrite: NEGATIVE
Protein, ur: NEGATIVE mg/dL
Specific Gravity, Urine: 1.023 (ref 1.005–1.030)
pH: 6 (ref 5.0–8.0)

## 2019-02-27 MED ORDER — COMFORT FIT MATERNITY SUPP SM MISC: 1.0000 [IU] | Freq: Every day | 0 refills | Status: DC | PRN

## 2019-02-27 NOTE — MAU Provider Note (Signed)
Chief Complaint: Leg Pain   First Provider Initiated Contact with Patient 02/27/19 1320     SUBJECTIVE HPI: Debbie Acevedo is a 23 y.o. G2P1001 at [redacted]w[redacted]d who presents to Maternity Admissions reporting bilateral leg pain. Reports numbness/tingling & pain that radiates from her hip to her knee on her outer thigh. Has had these symptoms in her right leg constantly since she had her daughter 4 years ago. Talked to her doctor about it when it first started & was told it was possibly due to "delivering her daughter naturally". Now she is feeling the same symptoms on her left leg for the last month.  Denies back pain, saddle numbness, urinary/fecal incontinence.  Denies contractions, vaginal bleeding, or LOF.  Rates pain 5/10. Pain is constant & worse with walking. Has not treated symptoms. Associated symptoms include numbness/tingling.    Past Medical History:  Diagnosis Date  . Hidradenitis suppurativa 2009   in both axilla and groin   . Urinary tract infection    current, on antibiotic   OB History  Gravida Para Term Preterm AB Living  2 1 1  0 0 1  SAB TAB Ectopic Multiple Live Births  0 0 0 0 1    # Outcome Date GA Lbr Len/2nd Weight Sex Delivery Anes PTL Lv  2 Current           1 Term 01/16/15 [redacted]w[redacted]d 03:00 / 00:14 3215 g F Vag-Spont Local  LIV   Past Surgical History:  Procedure Laterality Date  . CHOLECYSTECTOMY N/A 10/22/2012   Procedure: LAPAROSCOPIC CHOLECYSTECTOMY WITHOUT INTRAOPERATIVE CHOLANGIOGRAM;  Surgeon: Jerilynn Mages. Gerald Stabs, MD;  Location: [facility];  Service: Pediatrics;  Laterality: N/A;   Social History   Socioeconomic History  . Marital status: Single    Spouse name: Not on file  . Number of children: Not on file  . Years of education: Not on file  . Highest education level: Not on file  Occupational History  . Not on file  Social Needs  . Financial resource strain: Not on file  . Food insecurity    Worry: Not on file    Inability: Not on file  . Transportation  needs    Medical: Not on file    Non-medical: Not on file  Tobacco Use  . Smoking status: Never Smoker  . Smokeless tobacco: Never Used  Substance and Sexual Activity  . Alcohol use: No  . Drug use: No  . Sexual activity: Yes    Birth control/protection: Implant  Lifestyle  . Physical activity    Days per week: Not on file    Minutes per session: Not on file  . Stress: Not on file  Relationships  . Social Herbalist on phone: Not on file    Gets together: Not on file    Attends religious service: Not on file    Active member of club or organization: Not on file    Attends meetings of clubs or organizations: Not on file    Relationship status: Not on file  . Intimate partner violence    Fear of current or ex partner: Not on file    Emotionally abused: Not on file    Physically abused: Not on file    Forced sexual activity: Not on file  Other Topics Concern  . Not on file  Social History Narrative  . Not on file   Family History  Problem Relation Age of Onset  . Cancer Maternal Aunt   .  Hypertension Maternal Grandmother   . Cancer Maternal Grandfather    No current facility-administered medications on file prior to encounter.    Current Outpatient Medications on File Prior to Encounter  Medication Sig Dispense Refill  . Doxylamine-Pyridoxine 10-10 MG TBEC Take 1 tablet by mouth 2 (two) times a day. Take two tablets at night on day one and day two.  On day 3, take one tablet in the morning, if needed and two tablets at night. If symptoms persist, take one tablet in the morning, one at lunch and two in the evening. 60 tablet 1  . Prenatal Vit-Fe Fumarate-FA (PRENATAL MULTIVITAMIN) TABS tablet Take 1 tablet by mouth daily at 12 noon. Reported on 07/20/2015     No Known Allergies  I have reviewed patient's Past Medical Hx, Surgical Hx, Family Hx, Social Hx, medications and allergies.   Review of Systems  Constitutional: Negative.   Gastrointestinal: Negative.    Musculoskeletal:       +leg pain  Neurological: Positive for numbness (bilateral leg).    OBJECTIVE Patient Vitals for the past 24 hrs:  BP Temp Temp src Pulse Resp SpO2 Height Weight  02/27/19 1026 105/60 98.2 F (36.8 C) Oral 96 16 99 % - -  02/27/19 1021 - - - - - - 5\' 6"  (1.676 m) 95.4 kg   Constitutional: Well-developed, well-nourished female in no acute distress.  Cardiovascular: normal rate & rhythm, no murmur Respiratory: normal rate and effort. Lung sounds clear throughout GI: Abd soft, non-tender, Pos BS x 4. No guarding or rebound tenderness MS: Strength equal bilaterally Extremities nontender, no edema, normal ROM Neurologic: Alert and oriented x 4. DTR 2+. No clonus     LAB RESULTS Results for orders placed or performed during the hospital encounter of 02/27/19 (from the past 24 hour(s))  Urinalysis, Routine w reflex microscopic     Status: Abnormal   Collection Time: 02/27/19 10:38 AM  Result Value Ref Range   Color, Urine YELLOW YELLOW   APPearance HAZY (A) CLEAR   Specific Gravity, Urine 1.023 1.005 - 1.030   pH 6.0 5.0 - 8.0   Glucose, UA NEGATIVE NEGATIVE mg/dL   Hgb urine dipstick NEGATIVE NEGATIVE   Bilirubin Urine NEGATIVE NEGATIVE   Ketones, ur NEGATIVE NEGATIVE mg/dL   Protein, ur NEGATIVE NEGATIVE mg/dL   Nitrite NEGATIVE NEGATIVE   Leukocytes,Ua NEGATIVE NEGATIVE    IMAGING No results found.  MAU COURSE Orders Placed This Encounter  Procedures  . Urinalysis, Routine w reflex microscopic  . Discharge patient   Meds ordered this encounter  Medications  . Elastic Bandages & Supports (COMFORT FIT MATERNITY SUPP SM) MISC    Sig: 1 Units by Does not apply route daily as needed.    Dispense:  1 each    Refill:  0    Order Specific Question:   Supervising Provider    Answer:   CONSTANT, PEGGY [[id number]]    MDM FHT present via doppler   No red flag signs; no saddle numbness, severe pain, or incontinence Discussed treatment options &  recommendations with patient. Will give rx for maternity support belt & discussed possible referral to physical therapy by her ob/gyn. Patient agreeable with plan.   ASSESSMENT 1. Lumbar radiculopathy   2. [redacted] weeks gestation of pregnancy     PLAN Discharge home in stable condition. Discussed reasons to return to MAU vs ED Rx maternity support belt F/u with ob/gyn or PCP Follow-up Information    Ob/Gyn, [facility] Follow  up.   Contact information: [address] [place] [address] [place] Follow up.   Contact information: [address] [place] [phone]         Allergies as of 02/27/2019   No Known Allergies     Medication List    TAKE these medications   Comfort Fit Maternity Supp Sm Misc 1 Units by Does not apply route daily as needed.   Doxylamine-Pyridoxine 10-10 MG Tbec Take 1 tablet by mouth 2 (two) times a day. Take two tablets at night on day one and day two.  On day 3, take one tablet in the morning, if needed and two tablets at night. If symptoms persist, take one tablet in the morning, one at lunch and two in the evening.   prenatal multivitamin Tabs tablet Take 1 tablet by mouth daily at 12 noon. Reported on 07/20/2015        Jorje Guild, NP 02/27/2019  1:55 PM

## 2019-02-27 NOTE — MAU Note (Signed)
Discharged out of triage by NP

## 2019-02-27 NOTE — Discharge Instructions (Signed)
PREGNANCY SUPPORT BELT: You are not alone, Seventy-five percent of women have some sort of abdominal or back pain at some point in their pregnancy. Your baby is growing at a fast pace, which means that your whole body is rapidly trying to adjust to the changes. As your uterus grows, your back may start feeling a bit under stress and this can result in back or abdominal pain that can go from mild, and therefore bearable, to severe pains that will not allow you to sit or lay down comfortably, When it comes to dealing with pregnancy-related pains and cramps, some pregnant women usually prefer natural remedies, which the market is filled with nowadays. For example, wearing a pregnancy support belt can help ease and lessen your discomfort and pain. WHAT ARE THE BENEFITS OF WEARING A PREGNANCY SUPPORT BELT? A pregnancy support belt provides support to the lower portion of the belly taking some of the weight of the growing uterus and distributing to the other parts of your body. It is designed make you comfortable and gives you extra support. Over the years, the pregnancy apparel market has been studying the needs and wants of pregnant women and they have come up with the most comfortable pregnancy support belts that woman could ever ask for. In fact, you will no longer have to wear a stretched-out or bulky pregnancy belt that is visible underneath your clothes and makes you feel even more uncomfortable. Nowadays, a pregnancy support belt is made of comfortable and stretchy materials that will not irritate your skin but will actually make you feel at ease and you will not even notice you are wearing it. They are easy to put on and adjust during the day and can be worn at night for additional support.  BENEFITS:  Relives Back pain  Relieves Abdominal Muscle and Leg Pain  Stabilizes the Pelvic Ring  Offers a Cushioned Abdominal Lift Pad  Relieves pressure on the Sciatic Nerve Within Minutes WHERE TO GET  YOUR PREGNANCY BELT: [employer] [phone] [phone], [place] [address]       Lumbosacral Radiculopathy Lumbosacral radiculopathy is a condition that involves the spinal nerves and nerve roots in the low back and bottom of the spine. The condition develops when these nerves and nerve roots move out of place or become inflamed and cause symptoms. What are the causes? This condition may be caused by:  Pressure from a disk that bulges out of place (herniated disk). A disk is a plate of soft cartilage that separates bones in the spine.  Disk changes that occur with age (disk degeneration).  A narrowing of the bones of the lower back (spinal stenosis).  A tumor.  An infection.  An injury that places sudden pressure on the disks that cushion the bones of your lower spine. What increases the risk? You are more likely to develop this condition if:  You are a female who is 72-47 years old.  You are a female who is 10-81 years old.  You use improper technique when lifting things.  You are overweight or live a sedentary lifestyle.  Your work requires frequent lifting.  You smoke.  You do repetitive activities that strain the spine. What are the signs or symptoms? Symptoms of this condition include:  Pain that goes down from your back into your legs (sciatica), usually on one side of the body. This is the most common symptom. The pain may be worse with sitting, coughing, or  sneezing.  Pain and numbness in your legs.  Muscle weakness.  Tingling.  Loss of bladder control or bowel control. How is this diagnosed? This condition may be diagnosed based on:  Your symptoms and medical history.  A physical exam. If the pain is lasting, you may have tests, such as:  MRI scan.  X-ray.  CT scan.  A type of X-ray used to examine the spinal canal after injecting a dye into your spine (myelogram).  A test to measure how electrical impulses move  through a nerve (nerve conduction study). How is this treated? Treatment may depend on the cause of the condition and may include:  Working with a physical therapist.  Taking pain medicine.  Applying heat and ice to affected areas.  Doing stretches to improve flexibility.  Doing exercises to strengthen back muscles.  Having chiropractic spinal manipulation.  Using transcutaneous electrical nerve stimulation (TENS) therapy.  Getting a steroid injection in the spine. In some cases, no treatment is needed. If the condition is long-lasting (chronic), or if symptoms are severe, treatment may involve surgery or lifestyle changes, such as following a weight-loss plan. Follow these instructions at home: Activity  Avoid bending and other activities that make the problem worse.  Maintain a proper position when standing or sitting: ? When standing, keep your upper back and neck straight, with your shoulders pulled back. Avoid slouching. ? When sitting, keep your back straight and relax your shoulders. Do not round your shoulders or pull them backward.  Do not sit or stand in one place for long periods of time.  Take brief periods of rest throughout the day. This will reduce your pain. It is usually better to rest by lying down or standing, not sitting.  When you are resting for longer periods, mix in some mild activity or stretching between periods of rest. This will help to prevent stiffness and pain.  Get regular exercise. Ask your health care provider what activities are safe for you. If you were shown how to do any exercises or stretches, do them as directed by your health care provider.  Do not lift anything that is heavier than 10 lb (4.5 kg) or the limit that you are told by your health care provider. Always use proper lifting technique, which includes: ? Bending your knees. ? Keeping the load close to your body. ? Avoiding twisting. Managing pain  If directed, put ice on the  affected area: ? Put ice in a plastic bag. ? Place a towel between your skin and the bag. ? Leave the ice on for 20 minutes, 2-3 times a day.  If directed, apply heat to the affected area as often as told by your health care provider. Use the heat source that your health care provider recommends, such as a moist heat pack or a heating pad. ? Place a towel between your skin and the heat source. ? Leave the heat on for 20-30 minutes. ? Remove the heat if your skin turns bright red. This is especially important if you are unable to feel pain, heat, or cold. You may have a greater risk of getting burned.  Take over-the-counter and prescription medicines only as told by your health care provider. General instructions  Sleep on a firm mattress in a comfortable position. Try lying on your side with your knees slightly bent. If you lie on your back, put a pillow under your knees.  Do not drive or use heavy machinery while taking prescription pain medicine.  If your health care provider prescribed a diet or exercise program, follow it as directed.  Keep all follow-up visits as told by your health care provider. This is important. Contact a health care provider if:  Your pain does not improve over time, even when taking pain medicines. Get help right away if:  You develop severe pain.  Your pain suddenly gets worse.  You develop increasing weakness in your legs.  You lose the ability to control your bladder or bowel.  You have difficulty walking or balancing.  You have a fever. Summary  Lumbosacral radiculopathy is a condition that occurs when the spinal nerves and nerve roots in the lower part of the spine move out of place or become inflamed and cause symptoms.  Symptoms include pain, numbness, and tingling that go down from your back into your legs (sciatica), muscle weakness, and loss of bladder control or bowel control.  If directed, apply ice or heat to the affected area as told  by your health care provider.  Follow instructions about activity, rest, and proper lifting technique. This information is not intended to replace advice given to you by your health care provider. Make sure you discuss any questions you have with your health care provider. Document Released: 05/26/2005 Document Revised: 05/14/2017 Document Reviewed: 05/14/2017 Elsevier Patient Education  [employer].

## 2019-02-27 NOTE — MAU Note (Signed)
Debbie Acevedo is a 23 y.o. at [redacted]w[redacted]d here in MAU reporting: feeling numbness, stinging pain, and tingling in both of her legs. This has been ongoing in right leg for 4 years since her 1st child was born and has been ongoing in her left leg for a couple of months now.   Onset of complaint: ongoing  Pain score: 5/10  Vitals:   02/27/19 1026  BP: 105/60  Pulse: 96  Resp: 16  Temp: 98.2 F (36.8 C)  SpO2: 99%     FHT: 154  Lab orders placed from triage: UA

## 2019-06-10 NOTE — L&D Delivery Note (Signed)
Pt presented in active labor at 6 cm. She had an epidural. She progressed rapidly to C/C/+3. She had occ late decels with great BTB variability. She pushed for 10 min and had a SVD of one live viable black female infant over an intact perineum. Double nuchal cord. Placenta -S/I. EBL-400cc. Baby to NBN. No complications.

## 2019-07-07 LAB — OB RESULTS CONSOLE GBS: GBS: NEGATIVE

## 2019-07-11 ENCOUNTER — Encounter (HOSPITAL_COMMUNITY): Payer: Self-pay | Admitting: Obstetrics

## 2019-07-11 ENCOUNTER — Inpatient Hospital Stay (HOSPITAL_COMMUNITY)
Admission: AD | Admit: 2019-07-11 | Discharge: 2019-07-11 | Disposition: A | Discharge status: Home / Self Care | Payer: Medicaid Other | Attending: Obstetrics | Admitting: Obstetrics

## 2019-07-11 DIAGNOSIS — R55 Syncope and collapse: Secondary | ICD-10-CM | POA: Diagnosis not present

## 2019-07-11 DIAGNOSIS — W19XXXA Unspecified fall, initial encounter: Secondary | ICD-10-CM | POA: Diagnosis not present

## 2019-07-11 DIAGNOSIS — O26893 Other specified pregnancy related conditions, third trimester: Secondary | ICD-10-CM | POA: Diagnosis not present

## 2019-07-11 DIAGNOSIS — O212 Late vomiting of pregnancy: Secondary | ICD-10-CM | POA: Insufficient documentation

## 2019-07-11 DIAGNOSIS — O368130 Decreased fetal movements, third trimester, not applicable or unspecified: Secondary | ICD-10-CM | POA: Insufficient documentation

## 2019-07-11 DIAGNOSIS — Z3A37 37 weeks gestation of pregnancy: Secondary | ICD-10-CM | POA: Insufficient documentation

## 2019-07-11 DIAGNOSIS — Z3689 Encounter for other specified antenatal screening: Secondary | ICD-10-CM

## 2019-07-11 LAB — CBC WITH DIFFERENTIAL/PLATELET
Abs Immature Granulocytes: 0.16 10*3/uL — ABNORMAL HIGH (ref 0.00–0.07)
Basophils Absolute: 0 10*3/uL (ref 0.0–0.1)
Basophils Relative: 0 %
Eosinophils Absolute: 0 10*3/uL (ref 0.0–0.5)
Eosinophils Relative: 0 %
HCT: 32.4 % — ABNORMAL LOW (ref 36.0–46.0)
Hemoglobin: 10.7 g/dL — ABNORMAL LOW (ref 12.0–15.0)
Immature Granulocytes: 1 %
Lymphocytes Relative: 7 %
Lymphs Abs: 1 10*3/uL (ref 0.7–4.0)
MCH: 28.8 pg (ref 26.0–34.0)
MCHC: 33 g/dL (ref 30.0–36.0)
MCV: 87.3 fL (ref 80.0–100.0)
Monocytes Absolute: 0.8 10*3/uL (ref 0.1–1.0)
Monocytes Relative: 6 %
Neutro Abs: 12.8 10*3/uL — ABNORMAL HIGH (ref 1.7–7.7)
Neutrophils Relative %: 86 %
Platelets: 345 10*3/uL (ref 150–400)
RBC: 3.71 MIL/uL — ABNORMAL LOW (ref 3.87–5.11)
RDW: 13.7 % (ref 11.5–15.5)
WBC: 14.9 10*3/uL — ABNORMAL HIGH (ref 4.0–10.5)
nRBC: 0 % (ref 0.0–0.2)

## 2019-07-11 LAB — URINALYSIS, ROUTINE W REFLEX MICROSCOPIC
Bilirubin Urine: NEGATIVE
Glucose, UA: NEGATIVE mg/dL
Hgb urine dipstick: NEGATIVE
Ketones, ur: 20 mg/dL — AB
Leukocytes,Ua: NEGATIVE
Nitrite: NEGATIVE
Protein, ur: 30 mg/dL — AB
Specific Gravity, Urine: 1.017 (ref 1.005–1.030)
pH: 6 (ref 5.0–8.0)

## 2019-07-11 LAB — COMPREHENSIVE METABOLIC PANEL
ALT: 12 U/L (ref 0–44)
AST: 16 U/L (ref 15–41)
Albumin: 2.3 g/dL — ABNORMAL LOW (ref 3.5–5.0)
Alkaline Phosphatase: 118 U/L (ref 38–126)
Anion gap: 10 (ref 5–15)
BUN: <5 mg/dL — ABNORMAL LOW (ref 6–20)
CO2: 21 mmol/L — ABNORMAL LOW (ref 22–32)
Calcium: 8.7 mg/dL — ABNORMAL LOW (ref 8.9–10.3)
Chloride: 105 mmol/L (ref 98–111)
Creatinine, Ser: 0.66 mg/dL (ref 0.44–1.00)
GFR calc Af Amer: >60 mL/min (ref >60)
GFR calc non Af Amer: >60 mL/min (ref >60)
Glucose, Bld: 81 mg/dL (ref 70–99)
Potassium: 3.7 mmol/L (ref 3.5–5.1)
Sodium: 136 mmol/L (ref 135–145)
Total Bilirubin: 0.5 mg/dL (ref 0.3–1.2)
Total Protein: 5.7 g/dL — ABNORMAL LOW (ref 6.5–8.1)

## 2019-07-11 MED ORDER — LACTATED RINGERS IV BOLUS
1000.0000 mL | Freq: Once | INTRAVENOUS | Status: AC
  Administered 2019-07-11: 1000 mL via INTRAVENOUS

## 2019-07-11 NOTE — Discharge Instructions (Signed)
Preventing Injuries During Pregnancy  Injuries can happen during pregnancy. Minor falls and accidents usually do not harm you or your baby. But some injuries can harm you and your baby. Tell your doctor about any injury you suffer. What can I do to avoid injuries? Safety  Remove rugs and loose objects on the floor.  Wear comfortable shoes that have a good grip. Do not wear shoes that have high heels.  Always wear your seat belt in the car. The lap belt should be below your belly. Always drive safely.  Do not ride on a motorcycle. Activity  Do not take part in rough and violent activities or sports.  Avoid: ? Walking on wet or slippery floors. ? Lifting heavy pots of boiling or hot liquids. ? Fixing electrical problems. ? Being near fires. General instructions  Take over-the-counter and prescription medicines only as told by your doctor.  Know your blood type and the blood type of the baby's father.  If you are a victim of domestic violence: ? Call your local emergency services (911 in the [place] ? Contact the [employer] Violence Hotline for help and support. Get help right away if:  You fall on your belly or receive any serious blow to your belly.  You have a stiff neck or neck pain after a fall or an injury.  You get a headache or have problems with vision after an injury.  You do not feel the baby move or the baby is not moving as much as normal.  You have been a victim of domestic violence or any other kind of attack.  You have been in a car accident.  You have bleeding from your vagina.  Fluid is leaking from your vagina.  You start to have cramping or pain in your belly (contractions).  You have very bad pain in your lower back.  You feel weak or pass out (faint).  You start to throw up (vomit) after an injury.  You have been burned. Summary  Some injuries that happen during pregnancy can do harm to the baby.  Tell your doctor about any  injury.  Take steps to avoid injury. This includes removing rugs and loose objects on the floor. Always wear your seat belt in the car.  Do not take part in rough and violent activities or sports.  Get help right away if you have any serious accident or injury. This information is not intended to replace advice given to you by your health care provider. Make sure you discuss any questions you have with your health care provider. Document Revised: 02/18/2019 Document Reviewed: 06/04/2016 Elsevier Patient Education  2020 [employer].

## 2019-07-11 NOTE — MAU Provider Note (Addendum)
History     CSN: [id number]  Arrival date and time: 07/11/19 1326   First Provider Initiated Contact with Patient 07/11/19 1416      Chief Complaint  Patient presents with  . Fall  . Decreased Fetal Movement   HPI  Ms. Debbie Acevedo is a 24 y.o. G2P1001 at [redacted]w[redacted]d who presents to MAU today with complaint of syncope around 1245 today. The patient was in her kitchen and felt faint and had brief LOC. She denies abdominal trauma. She denies abdominal pain, vaginal bleeding, LOF today. She reports decreased fetal movement since the fall. She has had N/V throughout the pregnancy including today. She denies any previous syncopal episodes. She receives her care at [facility].    OB History    Gravida  2   Para  1   Term  1   Preterm  0   AB  0   Living  1     SAB  0   TAB  0   Ectopic  0   Multiple  0   Live Births  1           Past Medical History:  Diagnosis Date  . Hidradenitis suppurativa 2009   in both axilla and groin   . Urinary tract infection    current, on antibiotic    Past Surgical History:  Procedure Laterality Date  . CHOLECYSTECTOMY N/A 10/22/2012   Procedure: LAPAROSCOPIC CHOLECYSTECTOMY WITHOUT INTRAOPERATIVE CHOLANGIOGRAM;  Surgeon: Jerilynn Mages. Gerald Stabs, MD;  Location: [facility];  Service: Pediatrics;  Laterality: N/A;    Family History  Problem Relation Age of Onset  . Cancer Maternal Aunt   . Hypertension Maternal Grandmother   . Cancer Maternal Grandfather     Social History   Tobacco Use  . Smoking status: Never Smoker  . Smokeless tobacco: Never Used  Substance Use Topics  . Alcohol use: No  . Drug use: No    Allergies: No Known Allergies  Medications Prior to Admission  Medication Sig Dispense Refill Last Dose  . Doxylamine-Pyridoxine 10-10 MG TBEC Take 1 tablet by mouth 2 (two) times a day. Take two tablets at night on day one and day two.  On day 3, take one tablet in the morning, if needed and two tablets at  night. If symptoms persist, take one tablet in the morning, one at lunch and two in the evening. 60 tablet 1   . Elastic Bandages & Supports (COMFORT FIT MATERNITY SUPP SM) MISC 1 Units by Does not apply route daily as needed. 1 each 0   . Prenatal Vit-Fe Fumarate-FA (PRENATAL MULTIVITAMIN) TABS tablet Take 1 tablet by mouth daily at 12 noon. Reported on 07/20/2015       Review of Systems  Constitutional: Negative for fever.  Gastrointestinal: Positive for nausea and vomiting. Negative for abdominal pain, constipation and diarrhea.  Genitourinary: Negative for dysuria, frequency, urgency, vaginal bleeding and vaginal discharge.   Physical Exam   Blood pressure 125/73, pulse (!) 117, last menstrual period 10/25/2018, currently breastfeeding.  Physical Exam  Nursing note and vitals reviewed. Constitutional: She is oriented to person, place, and time. She appears well-developed and well-nourished. No distress.  HENT:  Head: Normocephalic and atraumatic.  Cardiovascular: Normal rate.  Respiratory: Effort normal.  GI: Soft. She exhibits no distension and no mass. There is no abdominal tenderness. There is no rebound and no guarding.  Neurological: She is alert and oriented to person, place, and time.  Skin: Skin  is warm and dry. No erythema.  Psychiatric: She has a normal mood and affect.   Orthostatic VS for the past 24 hrs:  BP- Lying Pulse- Lying BP- Sitting Pulse- Sitting BP- Standing at 0 minutes Pulse- Standing at 0 minutes  07/11/19 1418 -- -- -- -- 125/73 117  07/11/19 1416 -- -- 135/67 119 -- --  07/11/19 1406 115/73 126 -- -- -- --    Results for orders placed or performed during the hospital encounter of 07/11/19 (from the past 24 hour(s))  CBC with Differential/Platelet     Status: Abnormal   Collection Time: 07/11/19  2:43 PM  Result Value Ref Range   WBC 14.9 (H) 4.0 - 10.5 K/uL   RBC 3.71 (L) 3.87 - 5.11 MIL/uL   Hemoglobin 10.7 (L) 12.0 - 15.0 g/dL   HCT 32.4 (L)  36.0 - 46.0 %   MCV 87.3 80.0 - 100.0 fL   MCH 28.8 26.0 - 34.0 pg   MCHC 33.0 30.0 - 36.0 g/dL   RDW 13.7 11.5 - 15.5 %   Platelets 345 150 - 400 K/uL   nRBC 0.0 0.0 - 0.2 %   Neutrophils Relative % 86 %   Neutro Abs 12.8 (H) 1.7 - 7.7 K/uL   Lymphocytes Relative 7 %   Lymphs Abs 1.0 0.7 - 4.0 K/uL   Monocytes Relative 6 %   Monocytes Absolute 0.8 0.1 - 1.0 K/uL   Eosinophils Relative 0 %   Eosinophils Absolute 0.0 0.0 - 0.5 K/uL   Basophils Relative 0 %   Basophils Absolute 0.0 0.0 - 0.1 K/uL   Immature Granulocytes 1 %   Abs Immature Granulocytes 0.16 (H) 0.00 - 0.07 K/uL  Comprehensive metabolic panel     Status: Abnormal   Collection Time: 07/11/19  2:43 PM  Result Value Ref Range   Sodium 136 135 - 145 mmol/L   Potassium 3.7 3.5 - 5.1 mmol/L   Chloride 105 98 - 111 mmol/L   CO2 21 (L) 22 - 32 mmol/L   Glucose, Bld 81 70 - 99 mg/dL   BUN <5 (L) 6 - 20 mg/dL   Creatinine, Ser 0.66 0.44 - 1.00 mg/dL   Calcium 8.7 (L) 8.9 - 10.3 mg/dL   Total Protein 5.7 (L) 6.5 - 8.1 g/dL   Albumin 2.3 (L) 3.5 - 5.0 g/dL   AST 16 15 - 41 U/L   ALT 12 0 - 44 U/L   Alkaline Phosphatase 118 38 - 126 U/L   Total Bilirubin 0.5 0.3 - 1.2 mg/dL   GFR calc non Af Amer >60 >60 mL/min   GFR calc Af Amer >60 >60 mL/min   Anion gap 10 5 - 15  Urinalysis, Routine w reflex microscopic     Status: Abnormal   Collection Time: 07/11/19  3:10 PM  Result Value Ref Range   Color, Urine YELLOW YELLOW   APPearance CLEAR CLEAR   Specific Gravity, Urine 1.017 1.005 - 1.030   pH 6.0 5.0 - 8.0   Glucose, UA NEGATIVE NEGATIVE mg/dL   Hgb urine dipstick NEGATIVE NEGATIVE   Bilirubin Urine NEGATIVE NEGATIVE   Ketones, ur 20 (A) NEGATIVE mg/dL   Protein, ur 30 (A) NEGATIVE mg/dL   Nitrite NEGATIVE NEGATIVE   Leukocytes,Ua NEGATIVE NEGATIVE   RBC / HPF 11-20 0 - 5 RBC/hpf   WBC, UA 0-5 0 - 5 WBC/hpf   Bacteria, UA RARE (A) NONE SEEN   Squamous Epithelial / LPF 0-5 0 - 5  Mucus PRESENT     Fetal  Monitoring: Baseline: 140 bpm Variability: moderate Accelerations: 15 x 15 Decelerations: none Contractions: few, irregular initially, then none  MAU Course  Procedures None  MDM UA, CBC, CMP and EKG ordered today  Orthostatic VS without concerning orthostatic changes IV LR bolus given CBC, CMP without gross abnormalities EKG - sinus tachycardia  Patient getting IV fluids. Care turned over to [facility], CNM Debbie Hough, PA-C 07/11/2019, 5:03 PM   Results for orders placed or performed during the hospital encounter of 07/11/19 (from the past 24 hour(s))  CBC with Differential/Platelet     Status: Abnormal   Collection Time: 07/11/19  2:43 PM  Result Value Ref Range   WBC 14.9 (H) 4.0 - 10.5 K/uL   RBC 3.71 (L) 3.87 - 5.11 MIL/uL   Hemoglobin 10.7 (L) 12.0 - 15.0 g/dL   HCT 32.4 (L) 36.0 - 46.0 %   MCV 87.3 80.0 - 100.0 fL   MCH 28.8 26.0 - 34.0 pg   MCHC 33.0 30.0 - 36.0 g/dL   RDW 13.7 11.5 - 15.5 %   Platelets 345 150 - 400 K/uL   nRBC 0.0 0.0 - 0.2 %   Neutrophils Relative % 86 %   Neutro Abs 12.8 (H) 1.7 - 7.7 K/uL   Lymphocytes Relative 7 %   Lymphs Abs 1.0 0.7 - 4.0 K/uL   Monocytes Relative 6 %   Monocytes Absolute 0.8 0.1 - 1.0 K/uL   Eosinophils Relative 0 %   Eosinophils Absolute 0.0 0.0 - 0.5 K/uL   Basophils Relative 0 %   Basophils Absolute 0.0 0.0 - 0.1 K/uL   Immature Granulocytes 1 %   Abs Immature Granulocytes 0.16 (H) 0.00 - 0.07 K/uL  Comprehensive metabolic panel     Status: Abnormal   Collection Time: 07/11/19  2:43 PM  Result Value Ref Range   Sodium 136 135 - 145 mmol/L   Potassium 3.7 3.5 - 5.1 mmol/L   Chloride 105 98 - 111 mmol/L   CO2 21 (L) 22 - 32 mmol/L   Glucose, Bld 81 70 - 99 mg/dL   BUN <5 (L) 6 - 20 mg/dL   Creatinine, Ser 0.66 0.44 - 1.00 mg/dL   Calcium 8.7 (L) 8.9 - 10.3 mg/dL   Total Protein 5.7 (L) 6.5 - 8.1 g/dL   Albumin 2.3 (L) 3.5 - 5.0 g/dL   AST 16 15 - 41 U/L   ALT 12 0 - 44 U/L   Alkaline Phosphatase 118 38  - 126 U/L   Total Bilirubin 0.5 0.3 - 1.2 mg/dL   GFR calc non Af Amer >60 >60 mL/min   GFR calc Af Amer >60 >60 mL/min   Anion gap 10 5 - 15  Urinalysis, Routine w reflex microscopic     Status: Abnormal   Collection Time: 07/11/19  3:10 PM  Result Value Ref Range   Color, Urine YELLOW YELLOW   APPearance CLEAR CLEAR   Specific Gravity, Urine 1.017 1.005 - 1.030   pH 6.0 5.0 - 8.0   Glucose, UA NEGATIVE NEGATIVE mg/dL   Hgb urine dipstick NEGATIVE NEGATIVE   Bilirubin Urine NEGATIVE NEGATIVE   Ketones, ur 20 (A) NEGATIVE mg/dL   Protein, ur 30 (A) NEGATIVE mg/dL   Nitrite NEGATIVE NEGATIVE   Leukocytes,Ua NEGATIVE NEGATIVE   RBC / HPF 11-20 0 - 5 RBC/hpf   WBC, UA 0-5 0 - 5 WBC/hpf   Bacteria, UA RARE (A) NONE SEEN   Squamous  Epithelial / LPF 0-5 0 - 5   Mucus PRESENT     Assessment and Plan  --24 y.o. G2P1001 at [redacted]w[redacted]d s/p fall --No concerning findings s/p four hours of continuous monitoring --Patient denies pain at time of discharge  --Discharge home in stable condition with bleeding precautions  Mallie Snooks, MSN, CNM Certified Nurse Midwife, [facility] for [employer], [employer] 07/11/19 7:35 PM

## 2019-07-11 NOTE — MAU Note (Addendum)
Pt is a G2P1 at 37.0 weeks that started feeling dizzy, with brief LOC that resulted in a fall today at around 12:45.  Pt was in the kitchen with FOB and tried to fall on her back when she noticed she was falling but is unsure of how she landed, does not feel that it was on her abdomen.   Pt had breakfast at around 9 am,  Does report vomiting several times a day most days, but is able to keep ice chips down, no history of fainting in the past, no other OB or medical concerns.

## 2019-07-20 ENCOUNTER — Inpatient Hospital Stay (HOSPITAL_COMMUNITY)
Admission: AD | Admit: 2019-07-20 | Discharge: 2019-07-20 | Disposition: A | Discharge status: Home / Self Care | Payer: Medicaid Other | Attending: Obstetrics & Gynecology | Admitting: Obstetrics & Gynecology

## 2019-07-20 ENCOUNTER — Other Ambulatory Visit: Payer: Self-pay

## 2019-07-20 ENCOUNTER — Encounter (HOSPITAL_COMMUNITY): Payer: Self-pay | Admitting: Obstetrics & Gynecology

## 2019-07-20 DIAGNOSIS — Z3A38 38 weeks gestation of pregnancy: Secondary | ICD-10-CM | POA: Insufficient documentation

## 2019-07-20 DIAGNOSIS — O26893 Other specified pregnancy related conditions, third trimester: Secondary | ICD-10-CM

## 2019-07-20 DIAGNOSIS — O99893 Other specified diseases and conditions complicating puerperium: Secondary | ICD-10-CM | POA: Insufficient documentation

## 2019-07-20 DIAGNOSIS — M7989 Other specified soft tissue disorders: Secondary | ICD-10-CM | POA: Diagnosis not present

## 2019-07-20 LAB — URINALYSIS, ROUTINE W REFLEX MICROSCOPIC
Bilirubin Urine: NEGATIVE
Glucose, UA: NEGATIVE mg/dL
Ketones, ur: NEGATIVE mg/dL
Nitrite: NEGATIVE
Protein, ur: NEGATIVE mg/dL
Specific Gravity, Urine: 1.013 (ref 1.005–1.030)
pH: 7 (ref 5.0–8.0)

## 2019-07-20 NOTE — MAU Provider Note (Addendum)
History     CSN: [id number]  Arrival date and time: 07/20/19 1153   First Provider Initiated Contact with Patient 07/20/19 1413      Chief Complaint  Patient presents with  . Foot Swelling   HPI   [name] is a 24 y.o. female G2P1001  @ [redacted]w[redacted]d here with concerns about swelling in her feet. States she noticed that starting around 36 weeks. No other symptoms associated with the swelling. States she feels the swelling is worse before bed.  Not wearing ted hose. No pain or vaginal bleeding, + fetal movement.  No elevated feet when home or sitting.   OB History    Gravida  2   Para  1   Term  1   Preterm  0   AB  0   Living  1     SAB  0   TAB  0   Ectopic  0   Multiple  0   Live Births  1           Past Medical History:  Diagnosis Date  . Hidradenitis suppurativa 2009   in both axilla and groin   . Urinary tract infection    current, on antibiotic    Past Surgical History:  Procedure Laterality Date  . CHOLECYSTECTOMY N/A 10/22/2012   Procedure: LAPAROSCOPIC CHOLECYSTECTOMY WITHOUT INTRAOPERATIVE CHOLANGIOGRAM;  Surgeon: Jerilynn Mages. Gerald Stabs, MD;  Location: [facility];  Service: Pediatrics;  Laterality: N/A;    Family History  Problem Relation Age of Onset  . Cancer Maternal Aunt   . Hypertension Maternal Grandmother   . Cancer Maternal Grandfather     Social History   Tobacco Use  . Smoking status: Never Smoker  . Smokeless tobacco: Never Used  Substance Use Topics  . Alcohol use: No  . Drug use: No    Allergies: No Known Allergies  Medications Prior to Admission  Medication Sig Dispense Refill Last Dose  . Doxylamine-Pyridoxine 10-10 MG TBEC Take 1 tablet by mouth 2 (two) times a day. Take two tablets at night on day one and day two.  On day 3, take one tablet in the morning, if needed and two tablets at night. If symptoms persist, take one tablet in the morning, one at lunch and two in the evening. 60 tablet 1   . Elastic Bandages &  Supports (COMFORT FIT MATERNITY SUPP SM) MISC 1 Units by Does not apply route daily as needed. 1 each 0   . Prenatal Vit-Fe Fumarate-FA (PRENATAL MULTIVITAMIN) TABS tablet Take 1 tablet by mouth daily at 12 noon. Reported on 07/20/2015      Results for orders placed or performed during the hospital encounter of 07/20/19 (from the past 48 hour(s))  Urinalysis, Routine w reflex microscopic     Status: Abnormal   Collection Time: 07/20/19  1:32 PM  Result Value Ref Range   Color, Urine YELLOW YELLOW   APPearance HAZY (A) CLEAR   Specific Gravity, Urine 1.013 1.005 - 1.030   pH 7.0 5.0 - 8.0   Glucose, UA NEGATIVE NEGATIVE mg/dL   Hgb urine dipstick SMALL (A) NEGATIVE   Bilirubin Urine NEGATIVE NEGATIVE   Ketones, ur NEGATIVE NEGATIVE mg/dL   Protein, ur NEGATIVE NEGATIVE mg/dL   Nitrite NEGATIVE NEGATIVE   Leukocytes,Ua TRACE (A) NEGATIVE   RBC / HPF 21-50 0 - 5 RBC/hpf   WBC, UA 0-5 0 - 5 WBC/hpf   Bacteria, UA RARE (A) NONE SEEN   Squamous Epithelial /  LPF 0-5 0 - 5   Mucus PRESENT    Ca Oxalate Crys, UA PRESENT     Comment: Performed at [facility] [address] [place], [place] [address]    Review of Systems  Constitutional: Negative for fever.  Eyes: Negative for photophobia.  Gastrointestinal: Negative for abdominal pain.  Genitourinary: Negative for vaginal bleeding.  Neurological: Negative for headaches.   Physical Exam   Blood pressure 128/73, pulse 100, temperature 98.4 F (36.9 C), temperature source Oral, resp. rate 16, height 5\' 6"  (1.676 m), weight 109 kg, last menstrual period 10/25/2018, SpO2 100 %, currently breastfeeding.   Patient Vitals for the past 24 hrs:  BP Temp Temp src Pulse Resp SpO2 Height Weight  07/20/19 1529 125/62 -- -- (!) 108 16 -- -- --  07/20/19 1423 115/73 -- -- 99 -- -- -- --  07/20/19 1327 128/73 -- -- 100 -- -- -- --  07/20/19 1228 124/70 98.4 F (36.9 C) Oral (!) 115 16 100 % 5\' 6"  (1.676 m) 109 kg    Physical Exam   Constitutional: She is oriented to person, place, and time. She appears well-developed and well-nourished. No distress.  HENT:  Head: Normocephalic.  Eyes: Pupils are equal, round, and reactive to light.  Cardiovascular: Normal pulses.  Musculoskeletal:        General: Normal range of motion.     Right ankle: Swelling present.     Left ankle: Swelling present.     Comments: 2+ pitting edema in lower extremities.   Neurological: She is alert and oriented to person, place, and time.  Skin: Skin is warm. She is not diaphoretic.  Psychiatric: Her behavior is normal.   Fetal Tracing: Baseline: 135 bpm Variability: Moderate  Accelerations: 15x15 Decelerations: None Toco: Irregular pattern.   MAU Course  Procedures  None  MDM  BP normal No pain at this time.  No ha or scotoma.  Urine culture   Assessment and Plan   A:  1. Swelling of lower extremity   2. [redacted] weeks gestation of pregnancy     P:  Discharge home in stable condition Discussed reasons to come back F/u with OB tomorrow Continue to check BP at home Elevated feet while sitting at home.  Increase oral fluid intake.   Lezlie Lye, NP 07/20/2019 5:26 PM

## 2019-07-20 NOTE — Discharge Instructions (Signed)

## 2019-07-20 NOTE — MAU Note (Signed)
PT c/o increased swelling in feet and hands in last week. Also say her heart is racing on and off today.  Denies cntx, LOF, VB. +FM.  Denies HA, visual changes, RUQ pain. Has labile Bps.

## 2019-07-21 LAB — CULTURE, OB URINE: Culture: <10000 — AB

## 2019-07-28 ENCOUNTER — Encounter (HOSPITAL_COMMUNITY): Payer: Self-pay | Admitting: Obstetrics and Gynecology

## 2019-07-28 ENCOUNTER — Inpatient Hospital Stay (HOSPITAL_COMMUNITY)
Admission: AD | Admit: 2019-07-28 | Discharge: 2019-07-28 | Disposition: A | Discharge status: Home / Self Care | Payer: Medicaid Other | Attending: Obstetrics and Gynecology | Admitting: Obstetrics and Gynecology

## 2019-07-28 ENCOUNTER — Other Ambulatory Visit: Payer: Self-pay

## 2019-07-28 DIAGNOSIS — Z3A39 39 weeks gestation of pregnancy: Secondary | ICD-10-CM

## 2019-07-28 DIAGNOSIS — O479 False labor, unspecified: Secondary | ICD-10-CM

## 2019-07-28 DIAGNOSIS — O4703 False labor before 37 completed weeks of gestation, third trimester: Secondary | ICD-10-CM

## 2019-07-28 DIAGNOSIS — O471 False labor at or after 37 completed weeks of gestation: Secondary | ICD-10-CM | POA: Diagnosis present

## 2019-07-28 NOTE — MAU Note (Signed)
Patient left without her discharge paper work.

## 2019-07-28 NOTE — MAU Provider Note (Signed)
Ms. Debbie Acevedo is a G2P1001 at [redacted]w[redacted]d seen in MAU for labor. RN labor check, not seen by provider.   SVE by RN Dilation: 4 Effacement (%): 70 Cervical Position: Posterior Station: -3 Presentation: Vertex Exam by:: Lauren COx RN    NST - FHR: 135 bpm / moderate variability / accels present / decels absent / TOCO: regular every 8 mins   Plan: No change in cervix in 1-1.5 hours D/C home with labor precautions Keep scheduled appt with [facility] this week  Fatima Blank, CNM  07/28/2019 8:21 PM

## 2019-07-28 NOTE — MAU Note (Signed)
Contracting maybe every 7 min, feeling it in her back.  Before she left home, her mucous plug fell out.  Was 3 cm yesterday when checked. Feeling pressure. No problems with preg.

## 2019-07-29 ENCOUNTER — Other Ambulatory Visit: Payer: Self-pay | Admitting: Obstetrics and Gynecology

## 2019-07-30 ENCOUNTER — Inpatient Hospital Stay (HOSPITAL_COMMUNITY)
Admission: AD | Admit: 2019-07-30 | Discharge: 2019-08-01 | DRG: 806 | Disposition: A | Discharge status: Home / Self Care | Payer: Medicaid Other | Attending: Obstetrics and Gynecology | Admitting: Obstetrics and Gynecology

## 2019-07-30 ENCOUNTER — Other Ambulatory Visit: Payer: Self-pay

## 2019-07-30 ENCOUNTER — Inpatient Hospital Stay (HOSPITAL_COMMUNITY): Payer: Medicaid Other | Admitting: Anesthesiology

## 2019-07-30 ENCOUNTER — Encounter (HOSPITAL_COMMUNITY): Payer: Self-pay | Admitting: Obstetrics and Gynecology

## 2019-07-30 DIAGNOSIS — Z3A39 39 weeks gestation of pregnancy: Secondary | ICD-10-CM | POA: Diagnosis not present

## 2019-07-30 DIAGNOSIS — Z20822 Contact with and (suspected) exposure to covid-19: Secondary | ICD-10-CM | POA: Diagnosis present

## 2019-07-30 DIAGNOSIS — O8612 Endometritis following delivery: Secondary | ICD-10-CM | POA: Diagnosis present

## 2019-07-30 DIAGNOSIS — Z6791 Unspecified blood type, Rh negative: Secondary | ICD-10-CM | POA: Diagnosis not present

## 2019-07-30 DIAGNOSIS — Z3490 Encounter for supervision of normal pregnancy, unspecified, unspecified trimester: Secondary | ICD-10-CM

## 2019-07-30 DIAGNOSIS — O26893 Other specified pregnancy related conditions, third trimester: Secondary | ICD-10-CM | POA: Diagnosis present

## 2019-07-30 DIAGNOSIS — O99893 Other specified diseases and conditions complicating puerperium: Secondary | ICD-10-CM | POA: Diagnosis not present

## 2019-07-30 DIAGNOSIS — O99214 Obesity complicating childbirth: Secondary | ICD-10-CM | POA: Diagnosis present

## 2019-07-30 DIAGNOSIS — R030 Elevated blood-pressure reading, without diagnosis of hypertension: Secondary | ICD-10-CM | POA: Diagnosis not present

## 2019-07-30 LAB — CBC WITH DIFFERENTIAL/PLATELET
Abs Immature Granulocytes: 0.17 10*3/uL — ABNORMAL HIGH (ref 0.00–0.07)
Basophils Absolute: 0 10*3/uL (ref 0.0–0.1)
Basophils Relative: 0 %
Eosinophils Absolute: 0.1 10*3/uL (ref 0.0–0.5)
Eosinophils Relative: 0 %
HCT: 32.4 % — ABNORMAL LOW (ref 36.0–46.0)
Hemoglobin: 10.6 g/dL — ABNORMAL LOW (ref 12.0–15.0)
Immature Granulocytes: 1 %
Lymphocytes Relative: 11 %
Lymphs Abs: 2.4 10*3/uL (ref 0.7–4.0)
MCH: 28.3 pg (ref 26.0–34.0)
MCHC: 32.7 g/dL (ref 30.0–36.0)
MCV: 86.6 fL (ref 80.0–100.0)
Monocytes Absolute: 1.7 10*3/uL — ABNORMAL HIGH (ref 0.1–1.0)
Monocytes Relative: 8 %
Neutro Abs: 16.4 10*3/uL — ABNORMAL HIGH (ref 1.7–7.7)
Neutrophils Relative %: 80 %
Platelets: 383 10*3/uL (ref 150–400)
RBC: 3.74 MIL/uL — ABNORMAL LOW (ref 3.87–5.11)
RDW: 14.6 % (ref 11.5–15.5)
WBC: 20.8 10*3/uL — ABNORMAL HIGH (ref 4.0–10.5)
nRBC: 0.1 % (ref 0.0–0.2)

## 2019-07-30 LAB — RESPIRATORY PANEL BY RT PCR (FLU A&B, COVID)
Influenza A by PCR: NEGATIVE
Influenza B by PCR: NEGATIVE
SARS Coronavirus 2 by RT PCR: NEGATIVE

## 2019-07-30 LAB — URINALYSIS, COMPLETE (UACMP) WITH MICROSCOPIC
Bilirubin Urine: NEGATIVE
Glucose, UA: NEGATIVE mg/dL
Ketones, ur: NEGATIVE mg/dL
Nitrite: NEGATIVE
Protein, ur: 30 mg/dL — AB
RBC / HPF: >50 RBC/hpf — ABNORMAL HIGH (ref 0–5)
Specific Gravity, Urine: 1.006 (ref 1.005–1.030)
pH: 7 (ref 5.0–8.0)

## 2019-07-30 LAB — CBC
HCT: 33.3 % — ABNORMAL LOW (ref 36.0–46.0)
Hemoglobin: 10.6 g/dL — ABNORMAL LOW (ref 12.0–15.0)
MCH: 27.7 pg (ref 26.0–34.0)
MCHC: 31.8 g/dL (ref 30.0–36.0)
MCV: 87.2 fL (ref 80.0–100.0)
Platelets: 383 10*3/uL (ref 150–400)
RBC: 3.82 MIL/uL — ABNORMAL LOW (ref 3.87–5.11)
RDW: 14.6 % (ref 11.5–15.5)
WBC: 14.5 10*3/uL — ABNORMAL HIGH (ref 4.0–10.5)
nRBC: 0.1 % (ref 0.0–0.2)

## 2019-07-30 LAB — TYPE AND SCREEN
ABO/RH(D): B NEG
Antibody Screen: NEGATIVE

## 2019-07-30 LAB — RPR: RPR Ser Ql: NONREACTIVE

## 2019-07-30 LAB — ABO/RH: ABO/RH(D): B NEG

## 2019-07-30 MED ORDER — FLEET ENEMA 7-19 GM/118ML RE ENEM: 1.0000 | ENEMA | RECTAL | Status: DC | PRN

## 2019-07-30 MED ORDER — WITCH HAZEL-GLYCERIN EX PADS: 1.0000 "application " | MEDICATED_PAD | CUTANEOUS | Status: DC | PRN

## 2019-07-30 MED ORDER — OXYCODONE-ACETAMINOPHEN 5-325 MG PO TABS: 2.0000 | ORAL_TABLET | ORAL | Status: DC | PRN

## 2019-07-30 MED ORDER — OXYCODONE-ACETAMINOPHEN 5-325 MG PO TABS: 1.0000 | ORAL_TABLET | ORAL | Status: DC | PRN

## 2019-07-30 MED ORDER — LACTATED RINGERS IV SOLN: 500.0000 mL | INTRAVENOUS | Status: DC | PRN

## 2019-07-30 MED ORDER — EPHEDRINE 5 MG/ML INJ: 10.0000 mg | INTRAVENOUS | Status: DC | PRN

## 2019-07-30 MED ORDER — SENNOSIDES-DOCUSATE SODIUM 8.6-50 MG PO TABS
2.0000 | ORAL_TABLET | ORAL | Status: DC
  Administered 2019-07-31 – 2019-08-01 (×2): 2 via ORAL
  Filled 2019-07-30 (×2): qty 2

## 2019-07-30 MED ORDER — SIMETHICONE 80 MG PO CHEW: 80.0000 mg | CHEWABLE_TABLET | ORAL | Status: DC | PRN

## 2019-07-30 MED ORDER — DIPHENHYDRAMINE HCL 50 MG/ML IJ SOLN: 12.5000 mg | INTRAMUSCULAR | Status: DC | PRN

## 2019-07-30 MED ORDER — OXYTOCIN 40 UNITS IN NORMAL SALINE INFUSION - SIMPLE MED
2.5000 [IU]/h | INTRAVENOUS | Status: DC
  Filled 2019-07-30: qty 1000

## 2019-07-30 MED ORDER — CLINDAMYCIN PHOSPHATE 900 MG/50ML IV SOLN
900.0000 mg | Freq: Three times a day (TID) | INTRAVENOUS | Status: AC
  Administered 2019-07-30 – 2019-07-31 (×3): 900 mg via INTRAVENOUS
  Filled 2019-07-30 (×3): qty 50

## 2019-07-30 MED ORDER — OXYTOCIN BOLUS FROM INFUSION
500.0000 mL | Freq: Once | INTRAVENOUS | Status: AC
  Administered 2019-07-30: 06:00:00 500 mL via INTRAVENOUS

## 2019-07-30 MED ORDER — MISOPROSTOL 200 MCG PO TABS
ORAL_TABLET | ORAL | Status: AC
  Filled 2019-07-30: qty 5

## 2019-07-30 MED ORDER — ONDANSETRON HCL 4 MG PO TABS: 4.0000 mg | ORAL_TABLET | ORAL | Status: DC | PRN

## 2019-07-30 MED ORDER — MISOPROSTOL 200 MCG PO TABS
1000.0000 ug | ORAL_TABLET | Freq: Once | ORAL | Status: AC
  Administered 2019-07-30: 08:00:00 1000 ug via RECTAL

## 2019-07-30 MED ORDER — LIDOCAINE HCL (PF) 1 % IJ SOLN
INTRAMUSCULAR | Status: DC | PRN
  Administered 2019-07-30: 8 mL via EPIDURAL

## 2019-07-30 MED ORDER — ACETAMINOPHEN 325 MG PO TABS: 650.0000 mg | ORAL_TABLET | ORAL | Status: DC | PRN

## 2019-07-30 MED ORDER — SOD CITRATE-CITRIC ACID 500-334 MG/5ML PO SOLN: 30.0000 mL | ORAL | Status: DC | PRN

## 2019-07-30 MED ORDER — ZOLPIDEM TARTRATE 5 MG PO TABS: 5.0000 mg | ORAL_TABLET | Freq: Every evening | ORAL | Status: DC | PRN

## 2019-07-30 MED ORDER — LACTATED RINGERS IV SOLN: INTRAVENOUS | Status: DC

## 2019-07-30 MED ORDER — IBUPROFEN 600 MG PO TABS
600.0000 mg | ORAL_TABLET | Freq: Four times a day (QID) | ORAL | Status: DC
  Administered 2019-07-30 – 2019-08-01 (×8): 600 mg via ORAL
  Filled 2019-07-30 (×8): qty 1

## 2019-07-30 MED ORDER — ACETAMINOPHEN 325 MG PO TABS
650.0000 mg | ORAL_TABLET | ORAL | Status: DC | PRN
  Administered 2019-07-30 (×2): 650 mg via ORAL
  Filled 2019-07-30 (×2): qty 2

## 2019-07-30 MED ORDER — CLINDAMYCIN PHOSPHATE 900 MG/50ML IV SOLN: 900.0000 mg | Freq: Three times a day (TID) | INTRAVENOUS | Status: DC

## 2019-07-30 MED ORDER — BENZOCAINE-MENTHOL 20-0.5 % EX AERO
1.0000 "application " | INHALATION_SPRAY | CUTANEOUS | Status: DC | PRN
  Administered 2019-07-30: 1 via TOPICAL
  Filled 2019-07-30: qty 56

## 2019-07-30 MED ORDER — DIBUCAINE (PERIANAL) 1 % EX OINT: 1.0000 "application " | TOPICAL_OINTMENT | CUTANEOUS | Status: DC | PRN

## 2019-07-30 MED ORDER — ONDANSETRON HCL 4 MG/2ML IJ SOLN: 4.0000 mg | Freq: Four times a day (QID) | INTRAMUSCULAR | Status: DC | PRN

## 2019-07-30 MED ORDER — SODIUM CHLORIDE (PF) 0.9 % IJ SOLN
INTRAMUSCULAR | Status: DC | PRN
  Administered 2019-07-30: 12 mL/h via EPIDURAL

## 2019-07-30 MED ORDER — COCONUT OIL OIL: 1.0000 "application " | TOPICAL_OIL | Status: DC | PRN

## 2019-07-30 MED ORDER — PHENYLEPHRINE 40 MCG/ML (10ML) SYRINGE FOR IV PUSH (FOR BLOOD PRESSURE SUPPORT): 80.0000 ug | PREFILLED_SYRINGE | INTRAVENOUS | Status: DC | PRN

## 2019-07-30 MED ORDER — GENTAMICIN SULFATE 40 MG/ML IJ SOLN: 1.5000 mg/kg | Freq: Three times a day (TID) | INTRAVENOUS | Status: DC

## 2019-07-30 MED ORDER — ONDANSETRON HCL 4 MG/2ML IJ SOLN: 4.0000 mg | INTRAMUSCULAR | Status: DC | PRN

## 2019-07-30 MED ORDER — MEASLES, MUMPS & RUBELLA VAC IJ SOLR: 0.5000 mL | Freq: Once | INTRAMUSCULAR | Status: DC

## 2019-07-30 MED ORDER — FENTANYL-BUPIVACAINE-NACL 0.5-0.125-0.9 MG/250ML-% EP SOLN: 12.0000 mL/h | EPIDURAL | Status: DC | PRN

## 2019-07-30 MED ORDER — LACTATED RINGERS IV SOLN: 500.0000 mL | Freq: Once | INTRAVENOUS | Status: DC

## 2019-07-30 MED ORDER — LIDOCAINE HCL (PF) 1 % IJ SOLN: 30.0000 mL | INTRAMUSCULAR | Status: DC | PRN

## 2019-07-30 MED ORDER — TETANUS-DIPHTH-ACELL PERTUSSIS 5-2.5-18.5 LF-MCG/0.5 IM SUSP: 0.5000 mL | Freq: Once | INTRAMUSCULAR | Status: DC

## 2019-07-30 MED ORDER — GENTAMICIN SULFATE 40 MG/ML IJ SOLN
1.5000 mg/kg | Freq: Three times a day (TID) | INTRAVENOUS | Status: AC
  Administered 2019-07-30 – 2019-07-31 (×3): 170 mg via INTRAVENOUS
  Filled 2019-07-30 (×3): qty 4.25

## 2019-07-30 MED ORDER — FENTANYL-BUPIVACAINE-NACL 0.5-0.125-0.9 MG/250ML-% EP SOLN
12.0000 mL/h | EPIDURAL | Status: DC | PRN
  Filled 2019-07-30: qty 250

## 2019-07-30 NOTE — Progress Notes (Signed)
Dr. Wendi Snipes notified of elevated temp of 100.4. Ibuprofen and tylenol given. Will recheck in 2 hours and notify Dr. Wendi Snipes of temperature at that time. Timoteo Ace, RN

## 2019-07-30 NOTE — Progress Notes (Signed)
Post Partum Day 0 Subjective: no complaints, voiding and tolerating PO  Objective: Vitals:   07/30/19 0831 07/30/19 0915 07/30/19 1015 07/30/19 1230  BP: (!) 141/81 (!) 131/55 117/61   Pulse: 83 94 98   Resp: 16 20 20    Temp:  98.7 F (37.1 C) (!) 100.4 F (38 C) 99.6 F (37.6 C)  TempSrc:  Oral Oral Oral  SpO2:  100% 100%      Physical Exam:  General: alert and no distress Lochia: appropriate Uterine Fundus: firm DVT Evaluation: No evidence of DVT seen on physical exam.  Recent Labs    07/30/19 0306  WBC 14.5*  HGB 10.6*  HCT 33.3*  PLT 383   Assessment/Plan: Erianna U Hnat 24 y.o. G2P2002 PPD#0 sp SVD 1. PPC: no lacerations. EBL 400cc, Initially had increased bleeding postpartum, improved with rectal cytotec. Now appropriate, continue to monitor 2. Elevated BP immediately postpartum, no history of HTN, likely related to pain, cont to monitor 3. Elevated Temp to 100.9 postpartum, likely due to cytotec, no other s/sx of infection, cont to monitor 4. Obesity: BMI 39, encourage ambulation 5. Rh Neg, baby O+, will need rhogam Rubella, breastfeeding, baby girl in the room    LOS: 0 days   Farzana Koci K Taam-Akelman 07/30/2019, 1:53 PM

## 2019-07-30 NOTE — Anesthesia Preprocedure Evaluation (Signed)
Anesthesia Evaluation  Patient identified by MRN, date of birth, ID band Patient awake    Reviewed: Allergy & Precautions, H&P , NPO status , Patient's Chart, lab work & pertinent test results, reviewed documented beta blocker date and time   Airway Mallampati: II  TM Distance: >3 FB Neck ROM: full    Dental no notable dental hx.    Pulmonary neg pulmonary ROS,    Pulmonary exam normal breath sounds clear to auscultation       Cardiovascular negative cardio ROS Normal cardiovascular exam Rhythm:regular Rate:Normal     Neuro/Psych negative neurological ROS  negative psych ROS   GI/Hepatic negative GI ROS, Neg liver ROS,   Endo/Other  Morbid obesity  Renal/GU negative Renal ROS  negative genitourinary   Musculoskeletal   Abdominal (+) + obese,   Peds  Hematology negative hematology ROS (+)   Anesthesia Other Findings   Reproductive/Obstetrics (+) Pregnancy                             Anesthesia Physical Anesthesia Plan  ASA: III  Anesthesia Plan: Epidural   Post-op Pain Management:    Induction:   PONV Risk Score and Plan:   Airway Management Planned:   Additional Equipment:   Intra-op Plan:   Post-operative Plan:   Informed Consent: I have reviewed the patients History and Physical, chart, labs and discussed the procedure including the risks, benefits and alternatives for the proposed anesthesia with the patient or authorized representative who has indicated his/her understanding and acceptance.       Plan Discussed with: Anesthesiologist  Anesthesia Plan Comments:         Anesthesia Quick Evaluation

## 2019-07-30 NOTE — Progress Notes (Signed)
OBGYN Patient Vitals for the past 24 hrs:  BP Temp Temp src Pulse Resp SpO2  07/30/19 1810 -- 100.1 F (37.8 C) Oral (!) 123 -- --  07/30/19 1800 134/69 (!) 100.7 F (38.2 C) Oral (!) 126 18 98 %  07/30/19 1410 (!) 124/54 100 F (37.8 C) Oral 92 18 100 %  07/30/19 1230 -- 99.6 F (37.6 C) Oral -- -- --  07/30/19 1015 117/61 (!) 100.4 F (38 C) Oral 98 20 100 %  07/30/19 0915 (!) 131/55 98.7 F (37.1 C) Oral 94 20 100 %  07/30/19 0831 (!) 141/81 -- -- 83 16 --  07/30/19 0816 (!) 150/64 -- -- 96 20 --  07/30/19 0801 (!) 147/79 -- -- (!) 110 16 --  07/30/19 0731 (!) 144/70 98 F (36.7 C) Oral (!) 109 20 --  07/30/19 0646 (!) 119/55 -- -- (!) 114 16 --  07/30/19 0631 135/66 -- -- (!) 118 -- --  07/30/19 0619 (!) 149/64 -- -- (!) 122 18 --  07/30/19 0450 (!) 111/58 -- -- (!) 115 -- 99 %  07/30/19 0425 (!) 129/59 -- -- (!) 123 -- --  07/30/19 0420 125/80 -- -- 89 -- --  07/30/19 0345 113/65 -- -- (!) 108 -- --  07/30/19 0305 -- -- -- -- -- 99 %  07/30/19 0303 119/73 99.2 F (37.3 C) Oral 95 18 --   Debbie Acevedo 24 y.o. G2P2002 at PPD#0 sp uncomplicated SVD at [redacted]w[redacted]d with postpartum fever and tachycardia 1. Postpartum fever - denies any cough, SOB, breast concerns, abdominal pain, vaginal discharge, dysuria. Exam, tachycardia, regular rhythm, CTAB, abdomen appropriately tender, fundus firm < umbilicus, trace edema, epidural site without erythema, no breast erythema. Will repeat COVID test (asymptomatic, but was a rapid), UA/urine culture, CBC pending. DDX includes endometritis but given no uterine tenderness or discharge will hold off on treating as endometritis at this time. Continue to closely monitor.  Debbie Acevedo  07/30/19 7:36 PM

## 2019-07-30 NOTE — Anesthesia Procedure Notes (Signed)
Epidural Patient location during procedure: OB Start time: 07/30/2019 4:19 AM End time: 07/30/2019 4:23 AM  Staffing Anesthesiologist: Janeece Riggers, MD  Preanesthetic Checklist Completed: patient identified, IV checked, site marked, risks and benefits discussed, surgical consent, monitors and equipment checked, pre-op evaluation and timeout performed  Epidural Patient position: sitting Prep: DuraPrep and site prepped and draped Patient monitoring: continuous pulse ox and blood pressure Approach: midline Location: L3-L4 Injection technique: LOR air  Needle:  Needle type: Tuohy  Needle gauge: 17 G Needle length: 9 cm and 9 Needle insertion depth: 9 cm Catheter type: closed end flexible Catheter size: 19 Gauge Catheter at skin depth: 15 cm Test dose: negative  Assessment Events: blood not aspirated, injection not painful, no injection resistance, no paresthesia and negative IV test

## 2019-07-30 NOTE — Progress Notes (Signed)
OBGYN Note Recent Labs    07/30/19 0306 07/30/19 1920  WBC 14.5* 20.8*  HGB 10.6* 10.6*  HCT 33.3* 32.4*  PLT 383 383   WBC now elevated to 20, although it can be elevated postpartum/pregnancy, it is an increase from this AM and also abs immature granulocytes are 0.17. Therefore will proceed with empirically treating endometritis with clinda/gent. UA/Urine culture and COVID testing pending Ethelyn Cerniglia K Taam-Akelman 07/30/19 8:05 PM

## 2019-07-30 NOTE — Lactation Note (Signed)
This note was copied from a baby's chart. Lactation Consultation Note  Patient Name: Debbie Acevedo [id number] Date: 07/30/2019 Reason for consult: Initial assessment;Term  P2 mother whose infant is now 50 hours old.  This is a term baby.  Baby was awake in the bassinet and beginning to arouse.  Offered to assist with latching and mother accepted.  Mother's breasts are soft and non tender and nipples are short shafted and everted.  Breast tissue is compressible.  Asked permission to remove baby's clothing and mother agreeable.  Encouraged STS feeding with all feeds until baby is breast feeding well.  After a few attempts baby latched in the cross cradle hold on the left breast.  Since she remained very sleepy I demonstrated breast compressions and gentle stimulation. Baby began sucking and, with stimulation, continued for 5 minutes.  She released the breast and nipple was well rounded.  Placed her STS on mother's chest where she fell asleep.  Encouraged to feed 8-12 times/24 hours or sooner if baby shows feeding cues.  Reviewed cues.  Colostrum container provided for any EBM she obtains with hand expression.  Suggested mother continue to practice hand expression and feed any EBM she obtains to baby.  Milk storage times reviewed and finger feeding demonstrated.    Mother does not have a DEBP for home use.  She is enrolled in private insurance with [employer] but does not have a card yet.  Informed her that she can call her insurance company to determine eligibility for a DEBP.  Mother will do this.    Mom made aware of O/P services, breastfeeding support groups, community resources, and our phone # for post-discharge questions. Mother will call for latch assistance as needed.  No support person present at this time.   Maternal Data Formula Feeding for Exclusion: No Has patient been taught Hand Expression?: Yes  Feeding Feeding Type: Breast Fed  LATCH Score Latch: Repeated attempts  needed to sustain latch, nipple held in mouth throughout feeding, stimulation needed to elicit sucking reflex.  Audible Swallowing: None  Type of Nipple: Everted at rest and after stimulation  Comfort (Breast/Nipple): Soft / non-tender  Hold (Positioning): Assistance needed to correctly position infant at breast and maintain latch.  LATCH Score: 6  Interventions Interventions: Breast feeding basics reviewed;Assisted with latch;Skin to skin;Breast massage;Hand express;Breast compression;Adjust position;Position options;Support pillows  Lactation Tools Discussed/Used WIC Program: No   Consult Status Consult Status: Follow-up Date: 07/31/19 Follow-up type: In-patient    [name] 07/30/2019, 3:41 PM

## 2019-07-30 NOTE — Anesthesia Postprocedure Evaluation (Signed)
Anesthesia Post Note  Patient: Debbie Acevedo  Procedure(s) Performed: AN AD HOC LABOR EPIDURAL     Patient location during evaluation: Mother Baby Anesthesia Type: Epidural Level of consciousness: awake Pain management: satisfactory to patient Vital Signs Assessment: post-procedure vital signs reviewed and stable Respiratory status: spontaneous breathing Cardiovascular status: stable Anesthetic complications: no    Last Vitals:  Vitals:   07/30/19 1015 07/30/19 1230  BP: 117/61   Pulse: 98   Resp: 20   Temp: (!) 38 C 37.6 C  SpO2: 100%     Last Pain:  Vitals:   07/30/19 1230  TempSrc: Oral  PainSc:    Pain Goal:                   [employer]

## 2019-07-30 NOTE — H&P (Addendum)
Debbie Acevedo is an 24 y.o. G2P1001 [redacted]w[redacted]d black female who presented to the ER in active labor. She was 6 cm on admission. PNC was uncomplicated. GBS neg. Declined genetic testing. Normal OGTT  Past Medical History:  Diagnosis Date  . Hidradenitis suppurativa 2009   in both axilla and groin   . Urinary tract infection    current, on antibiotic    Past Surgical History:  Procedure Laterality Date  . CHOLECYSTECTOMY N/A 10/22/2012   Procedure: LAPAROSCOPIC CHOLECYSTECTOMY WITHOUT INTRAOPERATIVE CHOLANGIOGRAM;  Surgeon: Jerilynn Mages. Gerald Stabs, MD;  Location: [facility];  Service: Pediatrics;  Laterality: N/A;    Family History  Problem Relation Age of Onset  . Cancer Maternal Aunt   . Hypertension Maternal Grandmother   . Cancer Maternal Grandfather    Social History:  reports that she has never smoked. She has never used smokeless tobacco. She reports that she does not drink alcohol or use drugs.  Allergies: No Known Allergies  Medications Prior to Admission  Medication Sig Dispense Refill  . Prenatal Vit-Fe Fumarate-FA (PRENATAL MULTIVITAMIN) TABS tablet Take 1 tablet by mouth daily at 12 noon. Reported on 07/20/2015    . Doxylamine-Pyridoxine 10-10 MG TBEC Take 1 tablet by mouth 2 (two) times a day. Take two tablets at night on day one and day two.  On day 3, take one tablet in the morning, if needed and two tablets at night. If symptoms persist, take one tablet in the morning, one at lunch and two in the evening. 60 tablet 1  . Elastic Bandages & Supports (COMFORT FIT MATERNITY SUPP SM) MISC 1 Units by Does not apply route daily as needed. 1 each 0       Blood pressure (!) 111/58, pulse (!) 115, temperature 99.2 F (37.3 C), temperature source Oral, resp. rate 18, last menstrual period 10/25/2018, SpO2 99 %, currently breastfeeding. General appearance: alert, cooperative, mild distress and moderately obese Abdomen: gravid, non tender   Lab Results  Component Value Date   WBC 14.5  (H) 07/30/2019   HGB 10.6 (L) 07/30/2019   HCT 33.3 (L) 07/30/2019   MCV 87.2 07/30/2019   PLT 383 07/30/2019   Lab Results  Component Value Date   PREGTESTUR Positive (A) 12/16/2018   PREGSERUM NEGATIVE 10/22/2012   HCG 58.76 08/03/2014      Patient Active Problem List   Diagnosis Date Noted  . Normal labor 07/30/2019  . Singleton gestation with first pregnancy 12/24/2018  . Rh negative status during pregnancy 12/24/2018  . Pregnancy at early stage 12/16/2018  . Birth control counseling 10/12/2018  . Carbuncle 02/27/2014  . Ganglion cyst of wrist 05/14/2012  . Hidradenitis suppurativa 07/14/2011  . Biliary colic [id number]  . Benign mole 10/04/2010   IMP/ IUP at term in labor  Plan/ Admit  Olga Millers 07/30/2019, 5:45 AM

## 2019-07-31 LAB — SARS CORONAVIRUS 2 (TAT 6-24 HRS): SARS Coronavirus 2: NEGATIVE

## 2019-07-31 MED ORDER — RHO D IMMUNE GLOBULIN 1500 UNIT/2ML IJ SOSY
300.0000 ug | PREFILLED_SYRINGE | Freq: Once | INTRAMUSCULAR | Status: AC
  Administered 2019-07-31: 12:00:00 300 ug via INTRAVENOUS
  Filled 2019-07-31: qty 2

## 2019-07-31 NOTE — Progress Notes (Signed)
Post Partum Day 1 Subjective: no complaints, voiding and tolerating PO. Reports lochia<menses.   Objective: Vitals:   07/30/19 2148 07/30/19 2252 07/31/19 0308 07/31/19 0544  BP: (!) 139/94 121/80  118/75  Pulse: 99 100 95 88  Resp: 17 16    Temp: 98.6 F (37 C) 98.3 F (36.8 C) 98.2 F (36.8 C) 98.5 F (36.9 C)  TempSrc: Oral Axillary Oral Oral  SpO2:         Physical Exam:  General: alert and no distress Lochia: appropriate Uterine Fundus: firm DVT Evaluation: No evidence of DVT seen on physical exam.  Recent Labs    07/30/19 0306 07/30/19 1920  WBC 14.5* 20.8*  HGB 10.6* 10.6*  HCT 33.3* 32.4*  PLT 383 383   Assessment/Plan: Debbie Acevedo 23 y.o. G2P2002 PPD#1 sp TSVD 1. PPC: no lacerations. EBL 400cc, sp rectal cytotec. Doing appropriately.  2. Elevated BP immediately postpartum, no history of HTN, likely related to pain, cont to monitor 3. Postpartum fever: day 0, T max 100.9 postpartum, given tachycardia, rising WBC, no other signs of infection (COVID neg, Urine culture pending), treating empirically as endometritis, clinda/gent started last night, if remains afebrile will discontinue antibiotics at 24h.  4. Obesity: BMI 39, encourage ambulation 5. Rh Neg, baby O+, will need rhogam Rubella, breastfeeding, baby girl in the room    LOS: 1 day   Debbie Acevedo K Taam-Akelman 07/31/2019, 7:14 AM

## 2019-08-01 LAB — RH IG WORKUP (INCLUDES ABO/RH)
ABO/RH(D): B NEG
Fetal Screen: NEGATIVE
Gestational Age(Wks): 39.5
Unit division: 0

## 2019-08-01 LAB — URINE CULTURE

## 2019-08-01 NOTE — Discharge Summary (Signed)
Obstetric Discharge Summary Reason for Admission: onset of labor Prenatal Procedures: ultrasound Intrapartum Procedures: spontaneous vaginal delivery Postpartum Procedures: antibiotics for suspected endometritis, >24 hrs afebrile at time of delivery off abx Complications-Operative and Postpartum: none Hemoglobin  Date Value Ref Range Status  07/30/2019 10.6 (L) 12.0 - 15.0 g/dL Final  12/16/2018 13.6 11.1 - 15.9 g/dL Final   HCT  Date Value Ref Range Status  07/30/2019 32.4 (L) 36.0 - 46.0 % Final   Hematocrit  Date Value Ref Range Status  12/16/2018 41.4 34.0 - 46.6 % Final    Physical Exam:  General: alert and cooperative Lochia: appropriate Uterine Fundus: firm DVT Evaluation: No evidence of DVT seen on physical exam.  Discharge Diagnoses: Term Pregnancy-delivered  Discharge Information: Date: 08/01/2019 Activity: pelvic rest Diet: routine Medications: PNV and Ibuprofen Condition: stable Instructions: refer to practice specific booklet Discharge to: home Follow-up Information    Olga Millers, MD Follow up in 4 week(s).   Specialty: Obstetrics and Gynecology Contact information: [place] [id number] [phone]           Newborn Data: Live born female  Birth Weight: 7 lb 15.7 oz (3620 g) APGAR: 69, 10  Newborn Delivery   Birth date/time: 07/30/2019 05:53:00 Delivery type: Vaginal, Spontaneous      Home with mother.  Allyn Kenner 08/01/2019, 10:28 AM

## 2019-08-01 NOTE — Lactation Note (Signed)
This note was copied from a baby's chart. Lactation Consultation Note  Patient Name: Debbie Acevedo [id number] Date: 08/01/2019 Reason for consult: Follow-up assessment   Baby 58 hours old.  7.5% weight loss.  Stools transitioning to green per mother. Reviewed feeding frequency. Feed on demand with cues.  Goal 8-12+ times per day after first 24 hrs.  Place baby STS if not cueing.  Mother's breasts are leaking.  Provided mother with manual pump and breast pads. Mother used manual pump with good flow of transitional breast milk. Discussed waking baby for feedings and breastfeeding on both breasts. Noted pacifier in room.  Pacifier use not recommended at this time.  Reviewed engorgement care and monitoring voids/stools.    Maternal Data    Feeding Feeding Type: Breast Fed  LATCH Score                   Interventions Interventions: Breast feeding basics reviewed;Hand pump  Lactation Tools Discussed/Used     Consult Status Consult Status: Complete Date: 08/01/19    Vivianne Master [facility] 08/01/2019, 10:09 AM

## 2019-08-08 ENCOUNTER — Inpatient Hospital Stay (HOSPITAL_COMMUNITY): Payer: Medicaid Other

## 2019-08-08 ENCOUNTER — Inpatient Hospital Stay (HOSPITAL_COMMUNITY)
Admission: AD | Admit: 2019-08-08 | Payer: Medicaid Other | Source: Home / Self Care | Admitting: Obstetrics and Gynecology

## 2020-02-28 ENCOUNTER — Ambulatory Visit (INDEPENDENT_AMBULATORY_CARE_PROVIDER_SITE_OTHER): Payer: Medicaid Other

## 2020-02-28 ENCOUNTER — Other Ambulatory Visit: Payer: Self-pay

## 2020-02-28 DIAGNOSIS — Z23 Encounter for immunization: Secondary | ICD-10-CM

## 2020-02-28 NOTE — Progress Notes (Signed)
   Covid-19 Vaccination Clinic  Name:  Debbie Acevedo    MRN: [id number] DOB: 1995-09-24  02/28/2020  Ms. Debbie Acevedo was observed post Covid-19 immunization for 15 minutes without incident. She was provided with Vaccine Information Sheet and instruction to access the V-Safe system.   Ms. Debbie Acevedo was instructed to call 911 with any severe reactions post vaccine: Marland Kitchen Difficulty breathing  . Swelling of face and throat  . A fast heartbeat  . A bad rash all over body  . Dizziness and weakness   #1 Covid Vaccine administered RD without complication. #2 Covid Vaccine due 03/20/2020.

## 2020-03-20 ENCOUNTER — Ambulatory Visit: Payer: Medicaid Other

## 2020-04-24 IMAGING — US OBSTETRIC <14 WK ULTRASOUND
1 series · 15 of 28 positions shown · non-contrast
Comparison: None.

CLINICAL DATA: Dating

EXAM:
OBSTETRIC <14 WK ULTRASOUND
TECHNIQUE: Transabdominal ultrasound was performed for evaluation of the
gestation as well as the maternal uterus and adnexal regions.

[Series 1: obstetric <14 wk ultrasound · 51 acquisitions, 15 frames shown]
[im 1/51]
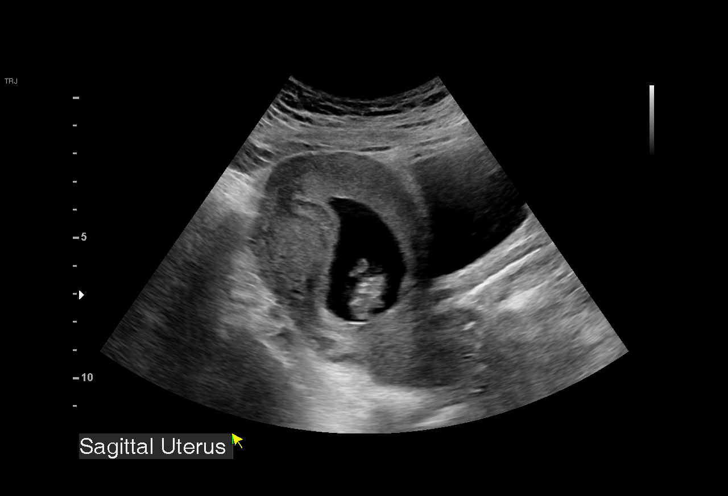
[im 4/51]
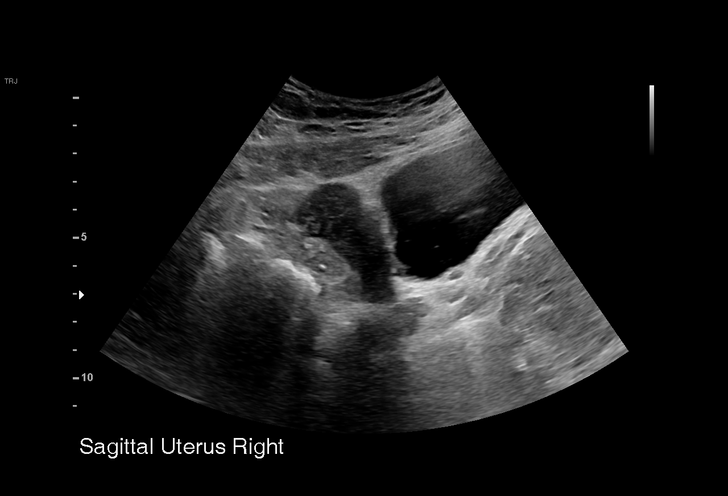
[im 8/51]
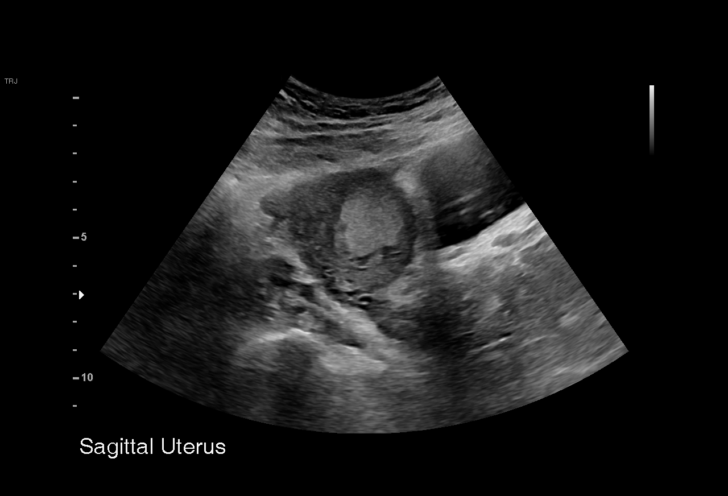
[im 12/51]
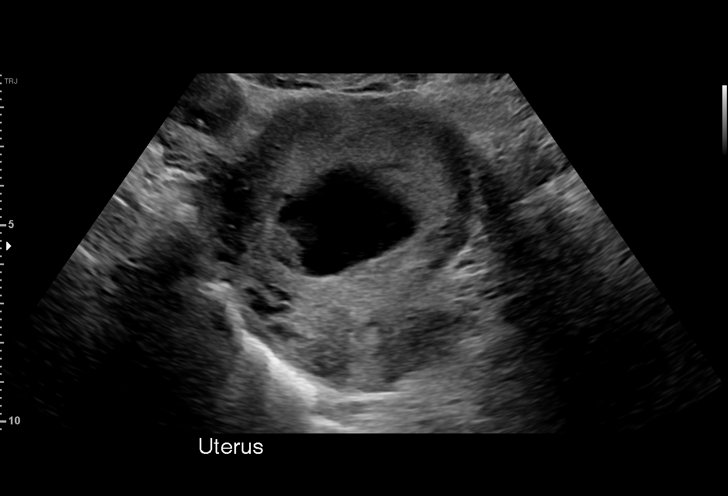
[im 15/51]
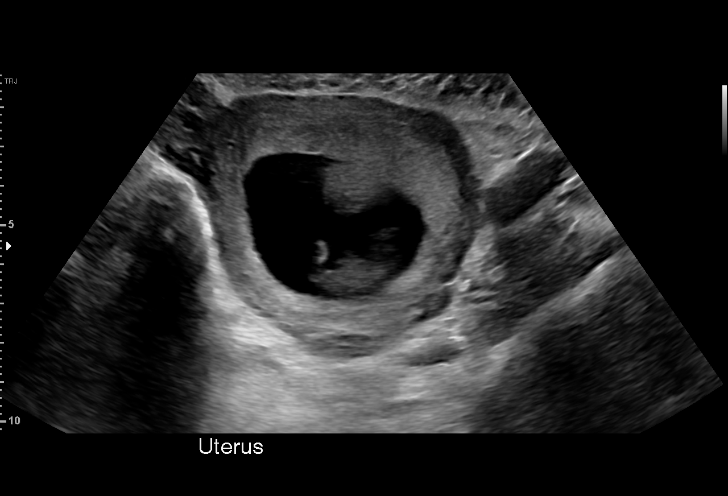
[im 19/51]
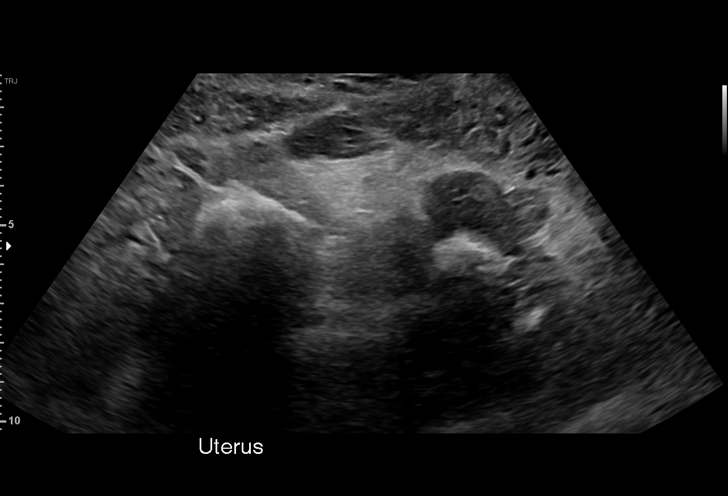
[im 23/51]
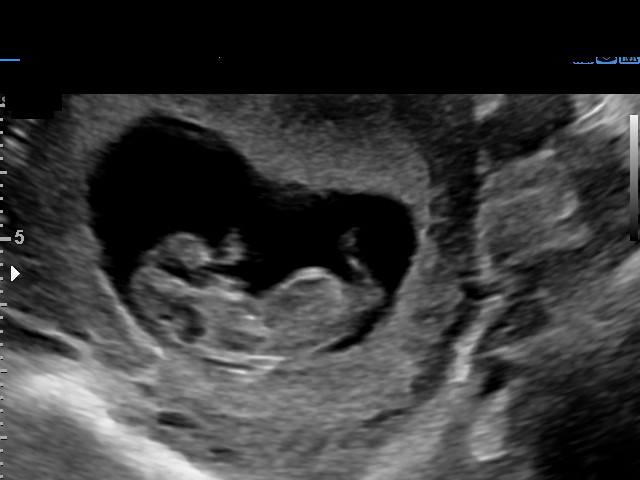
[im 26/51]
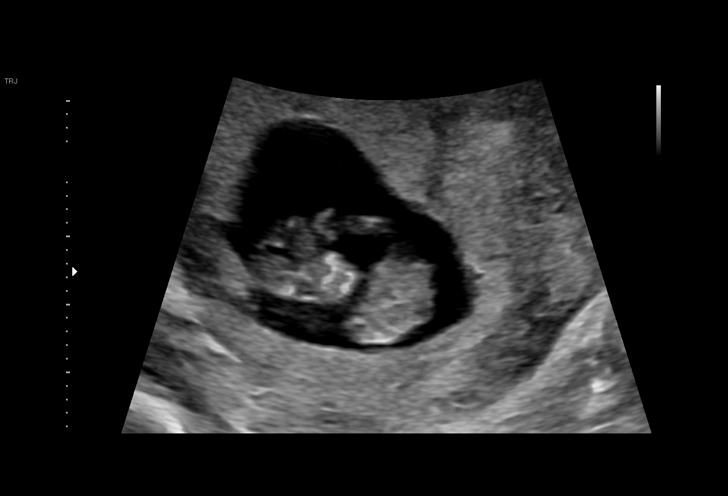
[im 28/51]
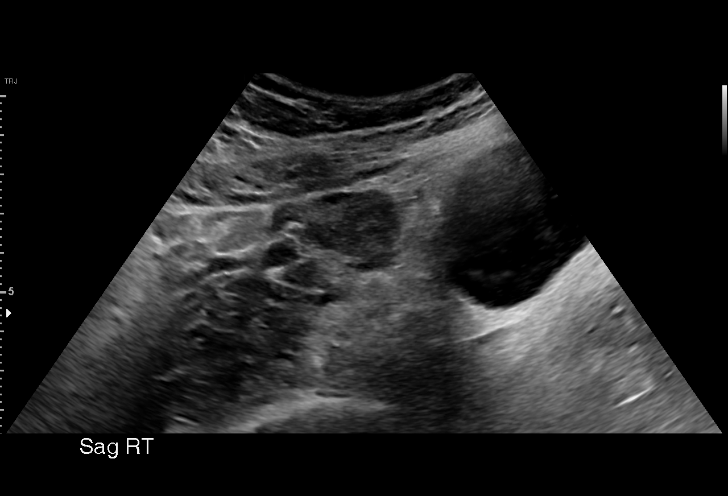
[im 32/51]
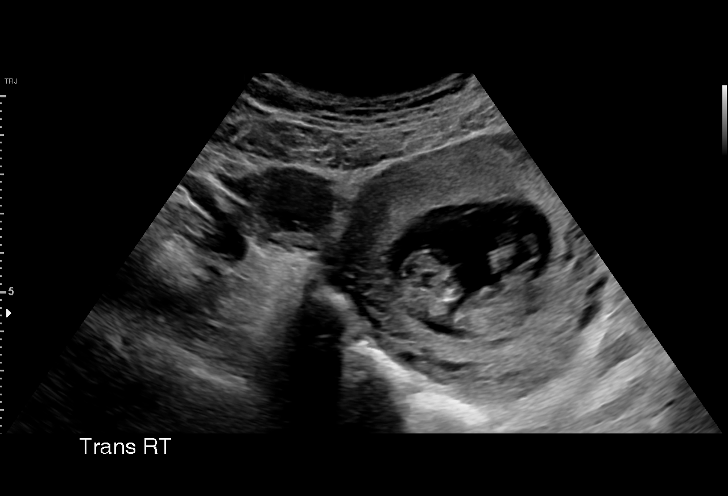
[im 36/51]
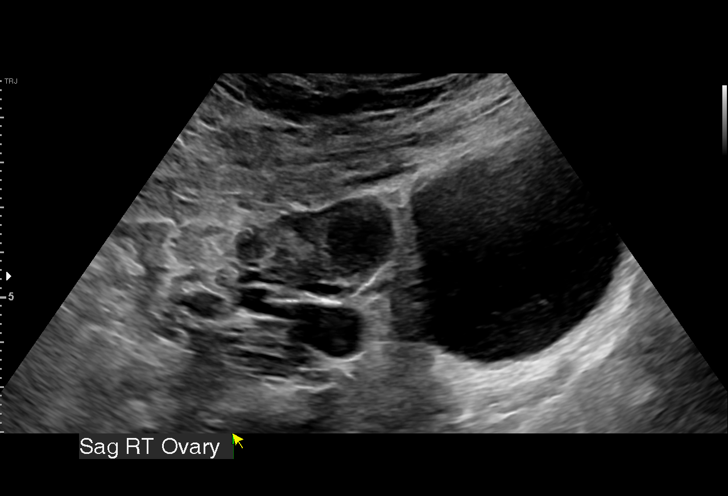
[im 39/51]
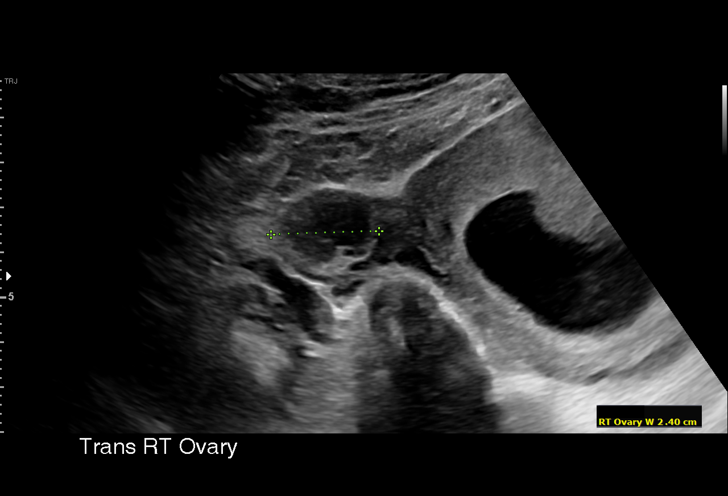
[im 43/51]
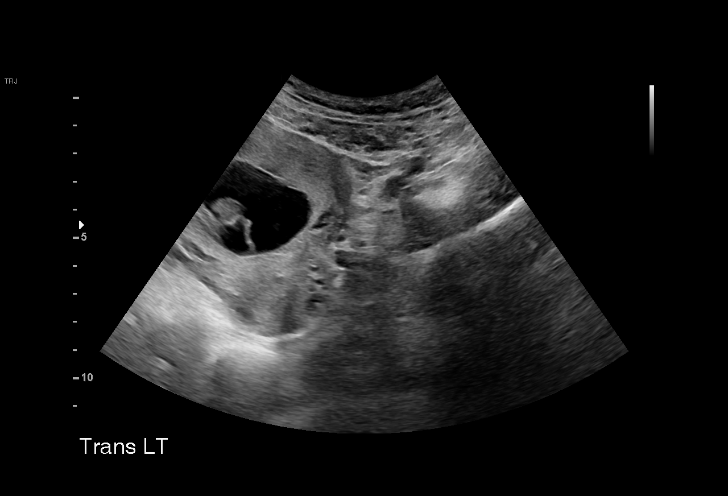
[im 47/51]
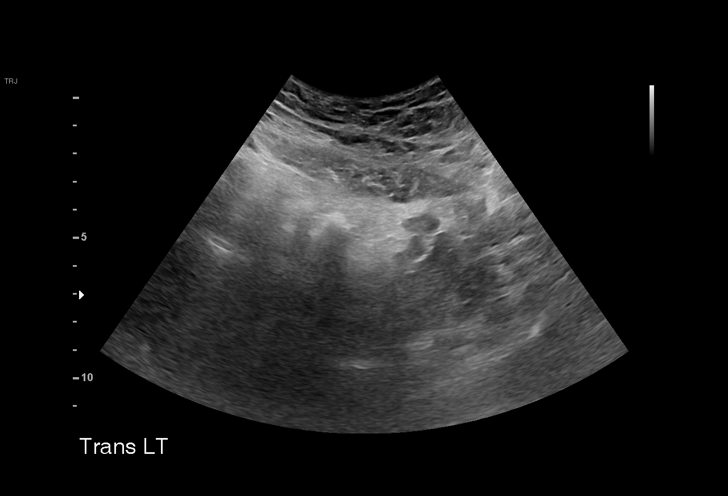
[im 51/51]
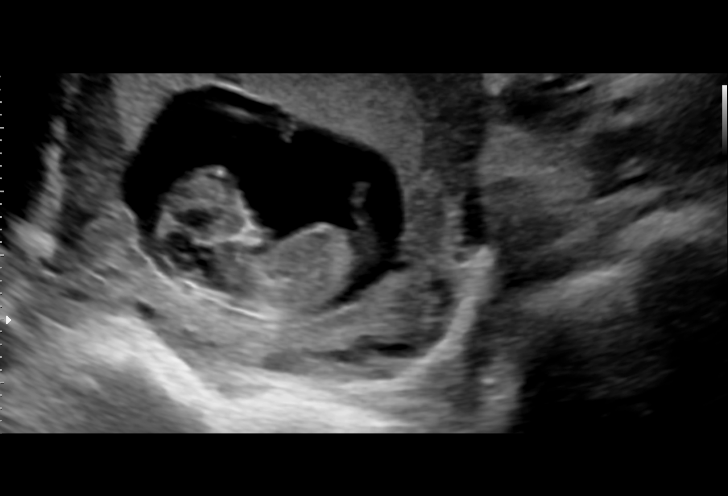

[15 of 28 positions shown; findings below may reference images not displayed]

FINDINGS: Intrauterine gestational sac: Single intrauterine gestational sac

Yolk sac:  Not visible

Embryo:  Visible

Cardiac Activity: Visible

Heart Rate: 165 bpm

CRL: 37.6 mm   10 w 4 d                  US EDC: 07/30/2019

Subchorionic hemorrhage:  None visualized.

Maternal uterus/adnexae: Bilateral ovaries are within normal limits.
The left ovary measures 3.1 x 1.3 x 1.6 cm. The right ovary measures
2.4 x 3.6 x 2.1 cm and contains a corpus luteum. No significant free
fluid
IMPRESSION: Single viable intrauterine pregnancy with estimated sonographic age
of 10 weeks 4 days and ultrasound EDD of 07/30/2019.

## 2021-02-06 ENCOUNTER — Other Ambulatory Visit: Payer: Self-pay

## 2021-02-06 ENCOUNTER — Ambulatory Visit (INDEPENDENT_AMBULATORY_CARE_PROVIDER_SITE_OTHER): Payer: Medicaid Other

## 2021-02-06 DIAGNOSIS — Z23 Encounter for immunization: Secondary | ICD-10-CM | POA: Diagnosis not present

## 2021-12-06 LAB — OB RESULTS CONSOLE GC/CHLAMYDIA
Chlamydia: NEGATIVE
Neisseria Gonorrhea: NEGATIVE

## 2022-02-11 LAB — OB RESULTS CONSOLE ANTIBODY SCREEN: Antibody Screen: NEGATIVE

## 2022-02-11 LAB — OB RESULTS CONSOLE RUBELLA ANTIBODY, IGM: Rubella: IMMUNE

## 2022-02-11 LAB — OB RESULTS CONSOLE HIV ANTIBODY (ROUTINE TESTING): HIV: NONREACTIVE

## 2022-02-11 LAB — OB RESULTS CONSOLE RPR: RPR: NONREACTIVE

## 2022-02-11 LAB — OB RESULTS CONSOLE HEPATITIS B SURFACE ANTIGEN: Hepatitis B Surface Ag: NEGATIVE

## 2022-02-11 LAB — HEPATITIS C ANTIBODY: HCV Ab: NEGATIVE

## 2022-02-11 LAB — OB RESULTS CONSOLE ABO/RH: RH Type: NEGATIVE

## 2022-02-12 ENCOUNTER — Inpatient Hospital Stay (HOSPITAL_COMMUNITY)
Admission: AD | Admit: 2022-02-12 | Discharge: 2022-02-12 | Disposition: A | Discharge status: Home / Self Care | Payer: Medicaid Other | Attending: Obstetrics and Gynecology | Admitting: Obstetrics and Gynecology

## 2022-02-12 ENCOUNTER — Encounter (HOSPITAL_COMMUNITY): Payer: Self-pay | Admitting: Obstetrics and Gynecology

## 2022-02-12 ENCOUNTER — Inpatient Hospital Stay (HOSPITAL_BASED_OUTPATIENT_CLINIC_OR_DEPARTMENT_OTHER): Payer: Medicaid Other

## 2022-02-12 ENCOUNTER — Other Ambulatory Visit: Payer: Self-pay

## 2022-02-12 DIAGNOSIS — O99512 Diseases of the respiratory system complicating pregnancy, second trimester: Secondary | ICD-10-CM | POA: Insufficient documentation

## 2022-02-12 DIAGNOSIS — J069 Acute upper respiratory infection, unspecified: Secondary | ICD-10-CM

## 2022-02-12 DIAGNOSIS — O26852 Spotting complicating pregnancy, second trimester: Secondary | ICD-10-CM

## 2022-02-12 DIAGNOSIS — O26892 Other specified pregnancy related conditions, second trimester: Secondary | ICD-10-CM | POA: Insufficient documentation

## 2022-02-12 DIAGNOSIS — Z20822 Contact with and (suspected) exposure to covid-19: Secondary | ICD-10-CM | POA: Diagnosis not present

## 2022-02-12 DIAGNOSIS — Z3A17 17 weeks gestation of pregnancy: Secondary | ICD-10-CM | POA: Diagnosis not present

## 2022-02-12 DIAGNOSIS — O209 Hemorrhage in early pregnancy, unspecified: Secondary | ICD-10-CM | POA: Diagnosis present

## 2022-02-12 DIAGNOSIS — O2342 Unspecified infection of urinary tract in pregnancy, second trimester: Secondary | ICD-10-CM | POA: Diagnosis not present

## 2022-02-12 DIAGNOSIS — O4692 Antepartum hemorrhage, unspecified, second trimester: Secondary | ICD-10-CM | POA: Diagnosis not present

## 2022-02-12 HISTORY — DX: Chlamydial infection, unspecified: A74.9

## 2022-02-12 LAB — URINALYSIS, ROUTINE W REFLEX MICROSCOPIC
Bacteria, UA: NONE SEEN
Bilirubin Urine: NEGATIVE
Glucose, UA: NEGATIVE mg/dL
Hgb urine dipstick: NEGATIVE
Ketones, ur: 80 mg/dL — AB
Leukocytes,Ua: NEGATIVE
Nitrite: NEGATIVE
Protein, ur: 30 mg/dL — AB
Specific Gravity, Urine: 1.025 (ref 1.005–1.030)
pH: 7 (ref 5.0–8.0)

## 2022-02-12 LAB — RESP PANEL BY RT-PCR (FLU A&B, COVID) ARPGX2
Influenza A by PCR: NEGATIVE
Influenza B by PCR: NEGATIVE
SARS Coronavirus 2 by RT PCR: NEGATIVE

## 2022-02-12 LAB — GROUP A STREP BY PCR: Group A Strep by PCR: NOT DETECTED

## 2022-02-12 LAB — POCT PREGNANCY, URINE: Preg Test, Ur: POSITIVE — AB

## 2022-02-12 MED ORDER — RHO D IMMUNE GLOBULIN 1500 UNIT/2ML IJ SOSY
300.0000 ug | PREFILLED_SYRINGE | Freq: Once | INTRAMUSCULAR | Status: AC
  Administered 2022-02-12: 300 ug via INTRAMUSCULAR
  Filled 2022-02-12: qty 2

## 2022-02-12 NOTE — MAU Provider Note (Signed)
History     CSN: [id number]  Arrival date and time: 02/12/22 1115   Event Date/Time   First Provider Initiated Contact with Patient 02/12/22 1157      Chief Complaint  Patient presents with   Abdominal Pain   Headache   26 y.o. G3P2002 '@17'$ .0 wks presenting with sore throat, congestion, HA, and sneezing. Sx started this am. Denies sick contacts. No fever or body aches. Reports spotting and cramping that started yesterday. No spotting today and cramping has continued but better. Was seen in the office yesterday and treated for UTI with an injection as she cannot take pills.    OB History     Gravida  3   Para  2   Term  2   Preterm  0   AB  0   Living  2      SAB  0   IAB  0   Ectopic  0   Multiple  0   Live Births  2        Obstetric Comments  BP elevation with 2nd preg         Past Medical History:  Diagnosis Date   Chlamydia    first preg   Hidradenitis suppurativa 06/10/2007   in both axilla and groin    Urinary tract infection    current, on antibiotic    Past Surgical History:  Procedure Laterality Date   CHOLECYSTECTOMY N/A 10/22/2012   Procedure: LAPAROSCOPIC CHOLECYSTECTOMY WITHOUT INTRAOPERATIVE CHOLANGIOGRAM;  Surgeon: Jerilynn Mages. Gerald Stabs, MD;  Location: [place];  Service: Pediatrics;  Laterality: N/A;    Family History  Problem Relation Age of Onset   Healthy Mother    Healthy Father    Cancer Maternal Aunt    Hypertension Maternal Grandmother    Cancer Maternal Grandfather     Social History   Tobacco Use   Smoking status: Never   Smokeless tobacco: Never  Vaping Use   Vaping Use: Never used  Substance Use Topics   Alcohol use: No   Drug use: No    Allergies: No Known Allergies  No medications prior to admission.    Review of Systems  Constitutional:  Negative for fever.  HENT:  Positive for congestion, sneezing and sore throat.   Respiratory:  Negative for shortness of breath.   Cardiovascular:  Negative for  chest pain.  Gastrointestinal:  Positive for abdominal pain and nausea.  Genitourinary:  Positive for vaginal bleeding. Negative for dysuria, urgency and vaginal discharge.   Physical Exam   Blood pressure 111/64, pulse (!) 106, temperature 99.6 F (37.6 C), temperature source Oral, resp. rate 18, height '5\' 6"'$  (1.676 m), weight 109.3 kg, SpO2 97 %, unknown if currently breastfeeding.  Physical Exam Vitals and nursing note reviewed.  Constitutional:      General: She is not in acute distress.    Appearance: Normal appearance.  HENT:     Head: Normocephalic and atraumatic.  Cardiovascular:     Rate and Rhythm: Tachycardia present.  Pulmonary:     Effort: Pulmonary effort is normal. No respiratory distress.  Musculoskeletal:        General: Normal range of motion.     Cervical back: Normal range of motion.  Neurological:     General: No focal deficit present.     Mental Status: She is alert and oriented to person, place, and time.  Psychiatric:        Mood and Affect: Mood normal.  Behavior: Behavior normal.   FHT 164  Results for orders placed or performed during the hospital encounter of 02/12/22 (from the past 24 hour(s))  Pregnancy, urine POC     Status: Abnormal   Collection Time: 02/12/22 11:31 AM  Result Value Ref Range   Preg Test, Ur POSITIVE (A) NEGATIVE  Urinalysis, Routine w reflex microscopic     Status: Abnormal   Collection Time: 02/12/22 11:33 AM  Result Value Ref Range   Color, Urine AMBER (A) YELLOW   APPearance HAZY (A) CLEAR   Specific Gravity, Urine 1.025 1.005 - 1.030   pH 7.0 5.0 - 8.0   Glucose, UA NEGATIVE NEGATIVE mg/dL   Hgb urine dipstick NEGATIVE NEGATIVE   Bilirubin Urine NEGATIVE NEGATIVE   Ketones, ur 80 (A) NEGATIVE mg/dL   Protein, ur 30 (A) NEGATIVE mg/dL   Nitrite NEGATIVE NEGATIVE   Leukocytes,Ua NEGATIVE NEGATIVE   RBC / HPF 11-20 0 - 5 RBC/hpf   WBC, UA 6-10 0 - 5 WBC/hpf   Bacteria, UA NONE SEEN NONE SEEN   Squamous  Epithelial / LPF 0-5 0 - 5   Mucus PRESENT    Amorphous Crystal PRESENT   Resp Panel by RT-PCR (Flu A&B, Covid) Anterior Nasal Swab     Status: None   Collection Time: 02/12/22 11:51 AM   Specimen: Anterior Nasal Swab  Result Value Ref Range   SARS Coronavirus 2 by RT PCR NEGATIVE NEGATIVE   Influenza A by PCR NEGATIVE NEGATIVE   Influenza B by PCR NEGATIVE NEGATIVE  Group A Strep by PCR     Status: None   Collection Time: 02/12/22 11:51 AM   Specimen: Anterior Nasal Swab; Sterile Swab  Result Value Ref Range   Group A Strep by PCR NOT DETECTED NOT DETECTED  Rh IG workup (includes ABO/Rh)     Status: None (Preliminary result)   Collection Time: 02/12/22 12:36 PM  Result Value Ref Range   Gestational Age(Wks) 17    ABO/RH(D) B NEG    Antibody Screen NEG    Unit Number [id number]    Blood Component Type RHIG    Unit division 00    Status of Unit ISSUED    Transfusion Status      OK TO TRANSFUSE Performed at [facility], [address] [place], [place] [address]    [place] MFM OB LIMITED  Result Date: 02/12/2022 ----------------------------------------------------------------------  OBSTETRICS REPORT                       (Signed Final 02/12/2022 03:25 pm) ---------------------------------------------------------------------- Patient Info  ID #:       [id number]                          D.O.B.:  Apr 15, 1996 (26 yrs)  Name:       Debbie Acevedo                Visit Date: 02/12/2022 12:40 pm ---------------------------------------------------------------------- Performed By  Attending:        Tama High MD        Referred By:      [facility]/Triage  Performed By:     Nevin Bloodgood          [facility] ---------------------------------------------------------------------- Orders  #  Description                           Code        Ordered By  1  [place] MFM OB LIMITED                     76815.01     Debbie Acevedo ----------------------------------------------------------------------  #  Order #                     Accession #                Episode #  1  [id number]                   [id number]                 [id number] ---------------------------------------------------------------------- Indications  Vaginal bleeding in pregnancy, second          O46.92  trimester  [redacted] weeks gestation of pregnancy                Z3A.17 ---------------------------------------------------------------------- Fetal Evaluation  Num Of Fetuses:         1  Fetal Heart Rate(bpm):  166  Cardiac Activity:       Observed  Presentation:           Cephalic  Placenta:               Anterior  P. Cord Insertion:      Visualized  Amniotic Fluid  AFI FV:      Within normal limits                              Largest Pocket(cm)                              2.52  Comment:    No placental abruption or previa identified. ---------------------------------------------------------------------- OB History  Blood Type:   B-  Gravidity:    3         Term:   2        Prem:   0        SAB:   0  TOP:          0       Ectopic:  0        Living: 2 ---------------------------------------------------------------------- Gestational Age  Clinical EDD:  17w 0d                                        EDD:   07/23/22  Best:          17w 0d     Det. By:  Clinical EDD             EDD:   07/23/22 ---------------------------------------------------------------------- Anatomy  Cranium:               Appears normal         Stomach:                Appears normal, left  sided  Heart:                 Appears normal         Kidneys:                Appear normal                         (4CH, axis, and                         situs)  Diaphragm:             Appears normal         Bladder:                Appears normal ---------------------------------------------------------------------- Cervix Uterus Adnexa   Cervix  Length:           4.18  cm.  Normal appearance by transabdominal scan.  Uterus  No abnormality visualized.  Right Ovary  Within normal limits.  Left Ovary  Within normal limits.  Cul De Sac  No free fluid seen.  Adnexa  No abnormality visualized. ---------------------------------------------------------------------- Impression  Patient was evaluated at the MAU for complaints of vaginal  bleeding.  A limited ultrasound study was performed.  Amniotic fluid is  normal and good fetal activity seen.  There is no evidence of  low-lying placenta or placenta previa.  No evidence of  subchorionic hemorrhage.  Patient was evaluated at the MAU for complaints of vaginal  bleeding.  A limited ultrasound study was performed.  Amniotic fluid is  normal and good fetal activity seen.  There is no evidence of  low-lying placenta or placenta previa.  No evidence of  subchorionic hemorrhage.  Blood type: B neg. ----------------------------------------------------------------------                 Tama High, MD Electronically Signed Final Report   02/12/2022 03:25 pm ----------------------------------------------------------------------   MAU Course  Procedures Rhogam  MDM Labs and [place] ordered and reviewed. No signs of threatened SAB or previa, cervical length normal. Sx consistent with resp virus, discussed supportive care. Stable for discharge home.   Assessment and Plan   1. [redacted] weeks gestation of pregnancy   2. Spotting affecting pregnancy in second trimester   3. Rh negative status during pregnancy in second trimester   4. Upper respiratory virus    Discharge home Follow up at [facility] as scheduled Return precautions [facility] meds list provided  Allergies as of 02/12/2022   No Known Allergies      Medication List     TAKE these medications    multivitamin-prenatal 27-0.8 MG Tabs tablet Take 1 tablet by mouth daily at 12 noon.        Julianne Handler, CNM 02/12/2022, 3:53 PM

## 2022-02-12 NOTE — MAU Note (Signed)
Debbie Acevedo is a 26 y.o. at 2w0dhere in MAU reporting: was at the office yesterday and was dx with UTI. Got a shot of abx at the office. Then around 1630 noticed some spotting. Then started cramping. Bleeding has stopped but still cramping. Started with sore throat, headache, and congestion this morning.   Onset of complaint: yesterday  Pain score: cramping 1/10, headache 10/10  Vitals:   02/12/22 1138  BP: 119/69  Pulse: (!) 110  Resp: 16  Temp: 99.6 F (37.6 C)  SpO2: 97%     FHT:164  Lab orders placed from triage: upt, ua

## 2022-02-12 NOTE — Discharge Instructions (Signed)

## 2022-02-13 LAB — RH IG WORKUP (INCLUDES ABO/RH)
ABO/RH(D): B NEG
Antibody Screen: NEGATIVE
Gestational Age(Wks): 17
Unit division: 0

## 2022-06-27 LAB — OB RESULTS CONSOLE GBS: GBS: NEGATIVE

## 2022-07-13 ENCOUNTER — Encounter (HOSPITAL_COMMUNITY): Payer: Self-pay | Admitting: Obstetrics and Gynecology

## 2022-07-13 ENCOUNTER — Inpatient Hospital Stay (HOSPITAL_COMMUNITY)
Admission: AD | Admit: 2022-07-13 | Discharge: 2022-07-13 | Disposition: A | Discharge status: Home / Self Care | Payer: Medicaid Other | Attending: Obstetrics and Gynecology | Admitting: Obstetrics and Gynecology

## 2022-07-13 DIAGNOSIS — Z0371 Encounter for suspected problem with amniotic cavity and membrane ruled out: Secondary | ICD-10-CM

## 2022-07-13 DIAGNOSIS — Z3A38 38 weeks gestation of pregnancy: Secondary | ICD-10-CM | POA: Diagnosis not present

## 2022-07-13 DIAGNOSIS — O98813 Other maternal infectious and parasitic diseases complicating pregnancy, third trimester: Secondary | ICD-10-CM | POA: Diagnosis not present

## 2022-07-13 DIAGNOSIS — B3731 Acute candidiasis of vulva and vagina: Secondary | ICD-10-CM

## 2022-07-13 DIAGNOSIS — A599 Trichomoniasis, unspecified: Secondary | ICD-10-CM

## 2022-07-13 DIAGNOSIS — O98819 Other maternal infectious and parasitic diseases complicating pregnancy, unspecified trimester: Secondary | ICD-10-CM

## 2022-07-13 DIAGNOSIS — O471 False labor at or after 37 completed weeks of gestation: Secondary | ICD-10-CM | POA: Insufficient documentation

## 2022-07-13 DIAGNOSIS — O23593 Infection of other part of genital tract in pregnancy, third trimester: Secondary | ICD-10-CM | POA: Diagnosis present

## 2022-07-13 LAB — WET PREP, GENITAL
Sperm: NONE SEEN
WBC, Wet Prep HPF POC: >=10 — AB (ref <10)

## 2022-07-13 LAB — POCT FERN TEST: POCT Fern Test: NEGATIVE

## 2022-07-13 MED ORDER — BENZOCAINE-MENTHOL 20-0.5 % EX AERO
1.0000 | INHALATION_SPRAY | Freq: Four times a day (QID) | CUTANEOUS | Status: DC | PRN
  Administered 2022-07-13: 1 via TOPICAL
  Filled 2022-07-13: qty 56

## 2022-07-13 MED ORDER — METRONIDAZOLE 500 MG PO TABS
2000.0000 mg | ORAL_TABLET | Freq: Once | ORAL | Status: AC
  Administered 2022-07-13: 2000 mg via ORAL
  Filled 2022-07-13: qty 4

## 2022-07-13 MED ORDER — FLUCONAZOLE 150 MG PO TABS
150.0000 mg | ORAL_TABLET | Freq: Once | ORAL | Status: AC
  Administered 2022-07-13: 150 mg via ORAL
  Filled 2022-07-13: qty 1

## 2022-07-13 MED ORDER — FLUCONAZOLE 150 MG PO TABS: 150.0000 mg | ORAL_TABLET | Freq: Once | ORAL | 0 refills | Status: AC

## 2022-07-13 NOTE — Progress Notes (Signed)
At discharge, BP elevated. Denies any HA, visual changes or epigastric pain. Acutely upset at current situation. Repeat normal.   Dr. Marvel Plan notified and patient to keep OB appointment at scheduled. Ok to discharge home.   Wende Mott, CNM 07/13/22 8:18 PM

## 2022-07-13 NOTE — MAU Provider Note (Signed)
S: Ms. Debbie Acevedo is a 27 y.o. G3P2002 at [redacted]w[redacted]d who presents to MAU today complaining of leaking of fluid since last night. She denies vaginal bleeding. She endorses contractions. She reports normal fetal movement.    O: BP 122/73   Pulse (!) 121   Temp 98.1 F (36.7 C) (Oral)   Resp 18   Ht '5\' 6"'$  (1.676 m)   Wt 116.5 kg   SpO2 97%   BMI 41.46 kg/m  GENERAL: Well-developed, well-nourished female in no acute distress.  HEAD: Normocephalic, atraumatic.  CHEST: Normal effort of breathing, regular heart rate ABDOMEN: Soft, nontender, gravid PELVIC: Normal external female genitalia. Vagina is pink and rugated. Cervix with normal contour, no lesions. Copious frothy white discharge.  No pooling.   Cervical exam:  Dilation: 3 Effacement (%): 50 Station: -3 Exam by:: CLen Blalock CNM   Fetal Monitoring: Baseline: 130 Variability: moderate Accelerations: 15x15 Decelerations: none Contractions: 10-15  Results for orders placed or performed during the hospital encounter of 07/13/22 (from the past 24 hour(s))  Fern Test     Status: None   Collection Time: 07/13/22  6:48 PM  Result Value Ref Range   POCT Fern Test Negative = intact amniotic membranes   Wet prep, genital     Status: Abnormal   Collection Time: 07/13/22  6:56 PM  Result Value Ref Range   Yeast Wet Prep HPF POC PRESENT (A) NONE SEEN   Trich, Wet Prep PRESENT (A) NONE SEEN   Clue Cells Wet Prep HPF POC PRESENT (A) NONE SEEN   WBC, Wet Prep HPF POC >=10 (A) <10   Sperm NONE SEEN      A: SIUP at 379w4d1. Trichomoniasis   2. Encounter for suspected premature rupture of amniotic membranes, with rupture of membranes not found   3. [redacted] weeks gestation of pregnancy   4. Candidiasis of vagina during pregnancy    -Metronidazole one dose and diflucan given in MAU -Discussed restrictions on sexual activity until 2 weeks after treatment.   P: -Discharge home in stable condition -Rx for diflucan sent to  patient's pharmacy -Third trimester precautions discussed -Patient advised to follow-up with OB as scheduled for prenatal care -Patient may return to MAU as needed or if her condition were to change or worsen   NeWende MottCNNorth [place]/09/2022 7:31 PM

## 2022-07-13 NOTE — Discharge Instructions (Signed)

## 2022-07-13 NOTE — MAU Note (Addendum)
.  Debbie Acevedo is a 26 y.o. at 76w4dhere in MAU reporting: irregular UC that have been on-going (5/10) and some irritation between her thighs that she is concerned about that started last night. She said it feels like how they feel when your thighs chaf. She also noticed that the bed was wet last night and she has continued to noticed small amounts of clear LOF since. She felt a normal amount of FM today, but has noticed less than normal FM since 0430. Denies VB.  Last SVE on 1/24: 3 cm/50/-2  LMP: N/A Onset of complaint: Yesterday Pain score: 5/10 Vitals:   07/13/22 1838  BP: 134/79  Pulse: (!) 123  Resp: 18  Temp: 98.1 F (36.7 C)  SpO2: 97%     FHT: 163 Lab orders placed from triage:  FMaryann Alar

## 2022-07-14 LAB — GC/CHLAMYDIA PROBE AMP (~~LOC~~) NOT AT ARMC
Chlamydia: NEGATIVE
Comment: NEGATIVE
Comment: NORMAL
Neisseria Gonorrhea: NEGATIVE

## 2022-07-18 ENCOUNTER — Telehealth (HOSPITAL_COMMUNITY): Payer: Self-pay | Admitting: *Deleted

## 2022-07-18 NOTE — Telephone Encounter (Signed)
Preadmission screen  

## 2022-07-20 ENCOUNTER — Inpatient Hospital Stay (HOSPITAL_COMMUNITY)
Admission: AD | Admit: 2022-07-20 | Discharge: 2022-07-23 | DRG: 787 | Disposition: A | Discharge status: Home / Self Care | Payer: Medicaid Other | Attending: Obstetrics and Gynecology | Admitting: Obstetrics and Gynecology

## 2022-07-20 ENCOUNTER — Other Ambulatory Visit: Payer: Self-pay

## 2022-07-20 ENCOUNTER — Encounter (HOSPITAL_COMMUNITY)
Admission: AD | Disposition: A | Discharge status: Home / Self Care | Payer: Self-pay | Source: Home / Self Care | Attending: Obstetrics and Gynecology

## 2022-07-20 ENCOUNTER — Inpatient Hospital Stay (HOSPITAL_COMMUNITY): Payer: Medicaid Other | Admitting: Certified Registered Nurse Anesthetist

## 2022-07-20 ENCOUNTER — Encounter (HOSPITAL_COMMUNITY): Payer: Self-pay | Admitting: Obstetrics and Gynecology

## 2022-07-20 DIAGNOSIS — O26893 Other specified pregnancy related conditions, third trimester: Secondary | ICD-10-CM | POA: Diagnosis present

## 2022-07-20 DIAGNOSIS — D72829 Elevated white blood cell count, unspecified: Secondary | ICD-10-CM | POA: Diagnosis present

## 2022-07-20 DIAGNOSIS — O321XX0 Maternal care for breech presentation, not applicable or unspecified: Principal | ICD-10-CM | POA: Diagnosis present

## 2022-07-20 DIAGNOSIS — O99214 Obesity complicating childbirth: Secondary | ICD-10-CM | POA: Diagnosis present

## 2022-07-20 DIAGNOSIS — Z6791 Unspecified blood type, Rh negative: Secondary | ICD-10-CM | POA: Diagnosis not present

## 2022-07-20 DIAGNOSIS — Z3A39 39 weeks gestation of pregnancy: Secondary | ICD-10-CM | POA: Diagnosis not present

## 2022-07-20 DIAGNOSIS — O134 Gestational [pregnancy-induced] hypertension without significant proteinuria, complicating childbirth: Secondary | ICD-10-CM

## 2022-07-20 DIAGNOSIS — O321XX1 Maternal care for breech presentation, fetus 1: Secondary | ICD-10-CM | POA: Diagnosis not present

## 2022-07-20 DIAGNOSIS — O9912 Other diseases of the blood and blood-forming organs and certain disorders involving the immune mechanism complicating childbirth: Secondary | ICD-10-CM | POA: Diagnosis present

## 2022-07-20 HISTORY — DX: Gestational (pregnancy-induced) hypertension without significant proteinuria, unspecified trimester: O13.9

## 2022-07-20 LAB — CBC
HCT: 33.8 % — ABNORMAL LOW (ref 36.0–46.0)
Hemoglobin: 11.7 g/dL — ABNORMAL LOW (ref 12.0–15.0)
MCH: 29.6 pg (ref 26.0–34.0)
MCHC: 34.6 g/dL (ref 30.0–36.0)
MCV: 85.6 fL (ref 80.0–100.0)
Platelets: 282 10*3/uL (ref 150–400)
RBC: 3.95 MIL/uL (ref 3.87–5.11)
RDW: 14.4 % (ref 11.5–15.5)
WBC: 15.6 10*3/uL — ABNORMAL HIGH (ref 4.0–10.5)
nRBC: 0 % (ref 0.0–0.2)

## 2022-07-20 LAB — TYPE AND SCREEN
ABO/RH(D): B NEG
Antibody Screen: NEGATIVE

## 2022-07-20 LAB — RPR: RPR Ser Ql: NONREACTIVE

## 2022-07-20 SURGERY — Surgical Case
Anesthesia: General | Site: Abdomen

## 2022-07-20 MED ORDER — KETOROLAC TROMETHAMINE 30 MG/ML IJ SOLN: 30.0000 mg | Freq: Four times a day (QID) | INTRAMUSCULAR | Status: AC | PRN

## 2022-07-20 MED ORDER — POVIDONE-IODINE 10 % EX SWAB: 2.0000 | Freq: Once | CUTANEOUS | Status: DC

## 2022-07-20 MED ORDER — ACETAMINOPHEN 325 MG PO TABS: 650.0000 mg | ORAL_TABLET | ORAL | Status: DC | PRN

## 2022-07-20 MED ORDER — MEASLES, MUMPS & RUBELLA VAC IJ SOLR: 0.5000 mL | Freq: Once | INTRAMUSCULAR | Status: DC

## 2022-07-20 MED ORDER — KETOROLAC TROMETHAMINE 30 MG/ML IJ SOLN
INTRAMUSCULAR | Status: AC
  Filled 2022-07-20: qty 1

## 2022-07-20 MED ORDER — METHYLERGONOVINE MALEATE 0.2 MG PO TABS: 0.2000 mg | ORAL_TABLET | ORAL | Status: DC | PRN

## 2022-07-20 MED ORDER — TETANUS-DIPHTH-ACELL PERTUSSIS 5-2.5-18.5 LF-MCG/0.5 IM SUSY: 0.5000 mL | PREFILLED_SYRINGE | Freq: Once | INTRAMUSCULAR | Status: DC

## 2022-07-20 MED ORDER — OXYCODONE HCL 5 MG PO TABS
ORAL_TABLET | ORAL | Status: AC
  Filled 2022-07-20: qty 1

## 2022-07-20 MED ORDER — ENOXAPARIN SODIUM 60 MG/0.6ML IJ SOSY
60.0000 mg | PREFILLED_SYRINGE | INTRAMUSCULAR | Status: DC
  Administered 2022-07-21 – 2022-07-23 (×3): 60 mg via SUBCUTANEOUS
  Filled 2022-07-20 (×3): qty 0.6

## 2022-07-20 MED ORDER — NALOXONE HCL 4 MG/10ML IJ SOLN: 1.0000 ug/kg/h | INTRAVENOUS | Status: DC | PRN

## 2022-07-20 MED ORDER — ACETAMINOPHEN 10 MG/ML IV SOLN
INTRAVENOUS | Status: AC
  Filled 2022-07-20: qty 100

## 2022-07-20 MED ORDER — LACTATED RINGERS IV SOLN: INTRAVENOUS | Status: DC

## 2022-07-20 MED ORDER — PHENYLEPHRINE 80 MCG/ML (10ML) SYRINGE FOR IV PUSH (FOR BLOOD PRESSURE SUPPORT)
PREFILLED_SYRINGE | INTRAVENOUS | Status: AC
  Filled 2022-07-20: qty 10

## 2022-07-20 MED ORDER — NALOXONE HCL 0.4 MG/ML IJ SOLN: 0.4000 mg | INTRAMUSCULAR | Status: DC | PRN

## 2022-07-20 MED ORDER — TERBUTALINE SULFATE 1 MG/ML IJ SOLN
INTRAMUSCULAR | Status: AC
  Filled 2022-07-20: qty 1

## 2022-07-20 MED ORDER — ACETAMINOPHEN 500 MG PO TABS
1000.0000 mg | ORAL_TABLET | Freq: Four times a day (QID) | ORAL | Status: DC
  Administered 2022-07-21 – 2022-07-23 (×8): 1000 mg via ORAL
  Filled 2022-07-20 (×12): qty 2

## 2022-07-20 MED ORDER — CEFAZOLIN SODIUM-DEXTROSE 2-4 GM/100ML-% IV SOLN
INTRAVENOUS | Status: AC
  Filled 2022-07-20: qty 100

## 2022-07-20 MED ORDER — KETOROLAC TROMETHAMINE 30 MG/ML IJ SOLN: 30.0000 mg | Freq: Once | INTRAMUSCULAR | Status: DC | PRN

## 2022-07-20 MED ORDER — OXYCODONE HCL 5 MG PO TABS: 5.0000 mg | ORAL_TABLET | Freq: Once | ORAL | Status: DC

## 2022-07-20 MED ORDER — LIDOCAINE-EPINEPHRINE (PF) 2 %-1:200000 IJ SOLN
INTRAMUSCULAR | Status: AC
  Filled 2022-07-20: qty 20

## 2022-07-20 MED ORDER — ONDANSETRON HCL 4 MG/2ML IJ SOLN: 4.0000 mg | Freq: Three times a day (TID) | INTRAMUSCULAR | Status: DC | PRN

## 2022-07-20 MED ORDER — DIPHENHYDRAMINE HCL 25 MG PO CAPS: 25.0000 mg | ORAL_CAPSULE | ORAL | Status: DC | PRN

## 2022-07-20 MED ORDER — CEFAZOLIN SODIUM-DEXTROSE 2-4 GM/100ML-% IV SOLN
2.0000 g | INTRAVENOUS | Status: AC
  Administered 2022-07-20: 2 g via INTRAVENOUS

## 2022-07-20 MED ORDER — FENTANYL CITRATE (PF) 100 MCG/2ML IJ SOLN: 50.0000 ug | Freq: Once | INTRAMUSCULAR | Status: DC

## 2022-07-20 MED ORDER — FENTANYL CITRATE (PF) 250 MCG/5ML IJ SOLN
INTRAMUSCULAR | Status: DC | PRN
  Administered 2022-07-20 (×2): 100 ug via INTRAVENOUS

## 2022-07-20 MED ORDER — OXYTOCIN-SODIUM CHLORIDE 30-0.9 UT/500ML-% IV SOLN
INTRAVENOUS | Status: DC | PRN
  Administered 2022-07-20 (×2): 30 [IU] via INTRAVENOUS

## 2022-07-20 MED ORDER — MENTHOL 3 MG MT LOZG: 1.0000 | LOZENGE | OROMUCOSAL | Status: DC | PRN

## 2022-07-20 MED ORDER — DIBUCAINE (PERIANAL) 1 % EX OINT: 1.0000 | TOPICAL_OINTMENT | CUTANEOUS | Status: DC | PRN

## 2022-07-20 MED ORDER — OXYCODONE-ACETAMINOPHEN 5-325 MG PO TABS: 1.0000 | ORAL_TABLET | ORAL | Status: DC | PRN

## 2022-07-20 MED ORDER — DIPHENHYDRAMINE HCL 25 MG PO CAPS: 25.0000 mg | ORAL_CAPSULE | Freq: Four times a day (QID) | ORAL | Status: DC | PRN

## 2022-07-20 MED ORDER — SOD CITRATE-CITRIC ACID 500-334 MG/5ML PO SOLN
30.0000 mL | ORAL | Status: DC | PRN
  Administered 2022-07-20: 30 mL via ORAL
  Filled 2022-07-20: qty 30

## 2022-07-20 MED ORDER — SUCCINYLCHOLINE CHLORIDE 200 MG/10ML IV SOSY
PREFILLED_SYRINGE | INTRAVENOUS | Status: AC
  Filled 2022-07-20: qty 10

## 2022-07-20 MED ORDER — SODIUM CHLORIDE (PF) 0.9 % IJ SOLN
INTRAMUSCULAR | Status: AC
  Filled 2022-07-20: qty 20

## 2022-07-20 MED ORDER — DEXAMETHASONE SODIUM PHOSPHATE 10 MG/ML IJ SOLN
INTRAMUSCULAR | Status: DC | PRN
  Administered 2022-07-20: 10 mg via INTRAVENOUS

## 2022-07-20 MED ORDER — STERILE WATER FOR IRRIGATION IR SOLN
Status: DC | PRN
  Administered 2022-07-20: 1

## 2022-07-20 MED ORDER — COCONUT OIL OIL: 1.0000 | TOPICAL_OIL | Status: DC | PRN

## 2022-07-20 MED ORDER — PRENATAL MULTIVITAMIN CH
1.0000 | ORAL_TABLET | Freq: Every day | ORAL | Status: DC
  Administered 2022-07-21 – 2022-07-23 (×3): 1 via ORAL
  Filled 2022-07-20 (×3): qty 1

## 2022-07-20 MED ORDER — OXYCODONE HCL 5 MG PO TABS
5.0000 mg | ORAL_TABLET | ORAL | Status: DC | PRN
  Administered 2022-07-20: 5 mg via ORAL
  Administered 2022-07-21: 10 mg via ORAL
  Administered 2022-07-21: 5 mg via ORAL
  Administered 2022-07-22 (×2): 10 mg via ORAL
  Filled 2022-07-20: qty 2
  Filled 2022-07-20: qty 1
  Filled 2022-07-20 (×2): qty 2
  Filled 2022-07-20: qty 1
  Filled 2022-07-20: qty 2

## 2022-07-20 MED ORDER — TERBUTALINE SULFATE 1 MG/ML IJ SOLN
0.2500 mg | Freq: Once | INTRAMUSCULAR | Status: AC
  Administered 2022-07-20: 0.25 mg via SUBCUTANEOUS

## 2022-07-20 MED ORDER — DEXMEDETOMIDINE HCL IN NACL 80 MCG/20ML IV SOLN
INTRAVENOUS | Status: AC
  Filled 2022-07-20: qty 20

## 2022-07-20 MED ORDER — DIPHENHYDRAMINE HCL 50 MG/ML IJ SOLN: 12.5000 mg | INTRAMUSCULAR | Status: DC | PRN

## 2022-07-20 MED ORDER — FENTANYL CITRATE (PF) 100 MCG/2ML IJ SOLN
INTRAMUSCULAR | Status: AC
  Filled 2022-07-20: qty 2

## 2022-07-20 MED ORDER — SOD CITRATE-CITRIC ACID 500-334 MG/5ML PO SOLN: 30.0000 mL | ORAL | Status: DC

## 2022-07-20 MED ORDER — IBUPROFEN 600 MG PO TABS
600.0000 mg | ORAL_TABLET | Freq: Four times a day (QID) | ORAL | Status: DC
  Administered 2022-07-21 – 2022-07-23 (×10): 600 mg via ORAL
  Filled 2022-07-20 (×10): qty 1

## 2022-07-20 MED ORDER — ACETAMINOPHEN 10 MG/ML IV SOLN
1000.0000 mg | Freq: Once | INTRAVENOUS | Status: AC
  Administered 2022-07-20: 1000 mg via INTRAVENOUS

## 2022-07-20 MED ORDER — KETOROLAC TROMETHAMINE 30 MG/ML IJ SOLN
30.0000 mg | Freq: Four times a day (QID) | INTRAMUSCULAR | Status: AC
  Administered 2022-07-20 – 2022-07-21 (×3): 30 mg via INTRAVENOUS
  Filled 2022-07-20 (×2): qty 1

## 2022-07-20 MED ORDER — SODIUM CHLORIDE 0.9 % IV SOLN
500.0000 mg | INTRAVENOUS | Status: AC
  Administered 2022-07-20: 500 mg via INTRAVENOUS
  Filled 2022-07-20: qty 5

## 2022-07-20 MED ORDER — METHYLERGONOVINE MALEATE 0.2 MG/ML IJ SOLN: 0.2000 mg | INTRAMUSCULAR | Status: DC | PRN

## 2022-07-20 MED ORDER — ACETAMINOPHEN 500 MG PO TABS
1000.0000 mg | ORAL_TABLET | Freq: Four times a day (QID) | ORAL | Status: AC
  Administered 2022-07-20 – 2022-07-21 (×3): 1000 mg via ORAL

## 2022-07-20 MED ORDER — LIDOCAINE-EPINEPHRINE (PF) 2 %-1:200000 IJ SOLN
INTRAMUSCULAR | Status: DC | PRN
  Administered 2022-07-20: 7 mL via INTRADERMAL
  Administered 2022-07-20: 3 mL via INTRADERMAL
  Administered 2022-07-20: 6 mL via INTRADERMAL
  Administered 2022-07-20: 4 mL via INTRADERMAL

## 2022-07-20 MED ORDER — PROPOFOL 10 MG/ML IV BOLUS
INTRAVENOUS | Status: AC
  Filled 2022-07-20: qty 20

## 2022-07-20 MED ORDER — PHENYLEPHRINE HCL-NACL 20-0.9 MG/250ML-% IV SOLN
INTRAVENOUS | Status: AC
  Filled 2022-07-20: qty 250

## 2022-07-20 MED ORDER — PHENYLEPHRINE HCL-NACL 20-0.9 MG/250ML-% IV SOLN
INTRAVENOUS | Status: DC | PRN
  Administered 2022-07-20: 60 ug/min via INTRAVENOUS

## 2022-07-20 MED ORDER — LIDOCAINE HCL (PF) 1 % IJ SOLN: 30.0000 mL | INTRAMUSCULAR | Status: DC | PRN

## 2022-07-20 MED ORDER — OXYTOCIN-SODIUM CHLORIDE 30-0.9 UT/500ML-% IV SOLN: 2.5000 [IU]/h | INTRAVENOUS | Status: AC

## 2022-07-20 MED ORDER — SODIUM CHLORIDE 0.9% FLUSH: 3.0000 mL | INTRAVENOUS | Status: DC | PRN

## 2022-07-20 MED ORDER — LACTATED RINGERS IV SOLN: 500.0000 mL | INTRAVENOUS | Status: DC | PRN

## 2022-07-20 MED ORDER — ONDANSETRON HCL 4 MG/2ML IJ SOLN: 4.0000 mg | Freq: Four times a day (QID) | INTRAMUSCULAR | Status: DC | PRN

## 2022-07-20 MED ORDER — OXYTOCIN-SODIUM CHLORIDE 30-0.9 UT/500ML-% IV SOLN
INTRAVENOUS | Status: AC
  Filled 2022-07-20: qty 500

## 2022-07-20 MED ORDER — FENTANYL CITRATE (PF) 100 MCG/2ML IJ SOLN
INTRAMUSCULAR | Status: DC | PRN
  Administered 2022-07-20: 100 ug via EPIDURAL

## 2022-07-20 MED ORDER — ONDANSETRON HCL 4 MG/2ML IJ SOLN
INTRAMUSCULAR | Status: DC | PRN
  Administered 2022-07-20: 4 mg via INTRAVENOUS

## 2022-07-20 MED ORDER — SODIUM CHLORIDE 0.9 % IR SOLN
Status: DC | PRN
  Administered 2022-07-20: 1000 mL

## 2022-07-20 MED ORDER — HYDROMORPHONE HCL 1 MG/ML IJ SOLN: 0.2500 mg | INTRAMUSCULAR | Status: DC | PRN

## 2022-07-20 MED ORDER — SENNOSIDES-DOCUSATE SODIUM 8.6-50 MG PO TABS
2.0000 | ORAL_TABLET | ORAL | Status: DC
  Administered 2022-07-21 – 2022-07-23 (×3): 2 via ORAL
  Filled 2022-07-20 (×4): qty 2

## 2022-07-20 MED ORDER — METOCLOPRAMIDE HCL 5 MG/ML IJ SOLN
INTRAMUSCULAR | Status: DC | PRN
  Administered 2022-07-20: 5 mg via INTRAVENOUS

## 2022-07-20 MED ORDER — ONDANSETRON HCL 4 MG/2ML IJ SOLN
INTRAMUSCULAR | Status: AC
  Filled 2022-07-20: qty 2

## 2022-07-20 MED ORDER — OXYTOCIN-SODIUM CHLORIDE 30-0.9 UT/500ML-% IV SOLN: 2.5000 [IU]/h | INTRAVENOUS | Status: DC

## 2022-07-20 MED ORDER — ZOLPIDEM TARTRATE 5 MG PO TABS: 5.0000 mg | ORAL_TABLET | Freq: Every evening | ORAL | Status: DC | PRN

## 2022-07-20 MED ORDER — MAGNESIUM HYDROXIDE 400 MG/5ML PO SUSP: 30.0000 mL | ORAL | Status: DC | PRN

## 2022-07-20 MED ORDER — SODIUM CHLORIDE 0.9 % IV SOLN
INTRAVENOUS | Status: AC
  Filled 2022-07-20: qty 5

## 2022-07-20 MED ORDER — SCOPOLAMINE 1 MG/3DAYS TD PT72: 1.0000 | MEDICATED_PATCH | Freq: Once | TRANSDERMAL | Status: DC

## 2022-07-20 MED ORDER — WITCH HAZEL-GLYCERIN EX PADS: 1.0000 | MEDICATED_PAD | CUTANEOUS | Status: DC | PRN

## 2022-07-20 MED ORDER — FENTANYL CITRATE (PF) 250 MCG/5ML IJ SOLN
INTRAMUSCULAR | Status: AC
  Filled 2022-07-20: qty 5

## 2022-07-20 MED ORDER — OXYCODONE-ACETAMINOPHEN 5-325 MG PO TABS: 2.0000 | ORAL_TABLET | ORAL | Status: DC | PRN

## 2022-07-20 MED ORDER — SIMETHICONE 80 MG PO CHEW
80.0000 mg | CHEWABLE_TABLET | ORAL | Status: DC | PRN
  Administered 2022-07-21 – 2022-07-22 (×3): 80 mg via ORAL
  Filled 2022-07-20 (×3): qty 1

## 2022-07-20 MED ORDER — DEXAMETHASONE SODIUM PHOSPHATE 10 MG/ML IJ SOLN
INTRAMUSCULAR | Status: AC
  Filled 2022-07-20: qty 1

## 2022-07-20 MED ORDER — MORPHINE SULFATE (PF) 0.5 MG/ML IJ SOLN
INTRAMUSCULAR | Status: AC
  Filled 2022-07-20: qty 10

## 2022-07-20 MED ORDER — OXYTOCIN BOLUS FROM INFUSION: 333.0000 mL | Freq: Once | INTRAVENOUS | Status: DC

## 2022-07-20 SURGICAL SUPPLY — 36 items
APL PRP STRL LF DISP 70% ISPRP (MISCELLANEOUS) ×2
APL SKNCLS STERI-STRIP NONHPOA (GAUZE/BANDAGES/DRESSINGS) ×1
BENZOIN TINCTURE PRP APPL 2/3 (GAUZE/BANDAGES/DRESSINGS) IMPLANT
CHLORAPREP W/TINT 26 (MISCELLANEOUS) ×4 IMPLANT
CLAMP UMBILICAL CORD (MISCELLANEOUS) ×2 IMPLANT
CLOTH BEACON ORANGE TIMEOUT ST (SAFETY) ×2 IMPLANT
DRSG OPSITE POSTOP 4X10 (GAUZE/BANDAGES/DRESSINGS) ×2 IMPLANT
ELECT REM PT RETURN 9FT ADLT (ELECTROSURGICAL) ×1
ELECTRODE REM PT RTRN 9FT ADLT (ELECTROSURGICAL) ×2 IMPLANT
EXTRACTOR VACUUM KIWI (MISCELLANEOUS) IMPLANT
EXTRACTOR VACUUM M CUP 4 TUBE (SUCTIONS) IMPLANT
GAUZE SPONGE 4X4 12PLY STRL LF (GAUZE/BANDAGES/DRESSINGS) IMPLANT
GLOVE BIOGEL PI IND STRL 7.0 (GLOVE) ×2 IMPLANT
GLOVE ORTHO TXT STRL SZ7.5 (GLOVE) ×2 IMPLANT
GOWN STRL REUS W/TWL LRG LVL3 (GOWN DISPOSABLE) ×4 IMPLANT
KIT ABG SYR 3ML LUER SLIP (SYRINGE) IMPLANT
MAT PREVALON FULL STRYKER (MISCELLANEOUS) IMPLANT
NDL HYPO 25X5/8 SAFETYGLIDE (NEEDLE) ×2 IMPLANT
NEEDLE HYPO 25X5/8 SAFETYGLIDE (NEEDLE) ×1 IMPLANT
NS IRRIG 1000ML POUR BTL (IV SOLUTION) ×2 IMPLANT
PACK C SECTION WH (CUSTOM PROCEDURE TRAY) ×2 IMPLANT
PAD ABD 7.5X8 STRL (GAUZE/BANDAGES/DRESSINGS) IMPLANT
PAD OB MATERNITY 4.3X12.25 (PERSONAL CARE ITEMS) ×2 IMPLANT
RETRACTOR TRAXI PANNICULUS (MISCELLANEOUS) IMPLANT
RTRCTR C-SECT PINK 25CM LRG (MISCELLANEOUS) ×2 IMPLANT
STRIP CLOSURE SKIN 1/2X4 (GAUZE/BANDAGES/DRESSINGS) IMPLANT
SUT CHROMIC 1 CTX 36 (SUTURE) ×4 IMPLANT
SUT PLAIN 0 NONE (SUTURE) IMPLANT
SUT PLAIN 2 0 XLH (SUTURE) IMPLANT
SUT VIC AB 0 CT1 27 (SUTURE) ×2
SUT VIC AB 0 CT1 27XBRD ANBCTR (SUTURE) ×4 IMPLANT
SUT VIC AB 2-0 CT1 (SUTURE) ×2 IMPLANT
SUT VIC AB 4-0 KS 27 (SUTURE) IMPLANT
TOWEL OR 17X24 6PK STRL BLUE (TOWEL DISPOSABLE) ×2 IMPLANT
TRAY FOLEY W/BAG SLVR 14FR LF (SET/KITS/TRAYS/PACK) ×2 IMPLANT
WATER STERILE IRR 1000ML POUR (IV SOLUTION) ×2 IMPLANT

## 2022-07-20 NOTE — H&P (Signed)
Debbie Acevedo is a 27 y.o. female, G3 P2002, EGA 39+ weeks with EDC 2-14 presenting for ctx.  On eval in MAU, reg ctx, VE 5 cm.  PNC essentially uncomplicated, had one episode of sl elevated BP. . OB History     Gravida  3   Para  2   Term  2   Preterm  0   AB  0   Living  2      SAB  0   IAB  0   Ectopic  0   Multiple  0   Live Births  2        Obstetric Comments  BP elevation with 2nd preg        Past Medical History:  Diagnosis Date   Chlamydia    first preg   Hidradenitis suppurativa 06/10/2007   in both axilla and groin    Pregnancy induced hypertension    Urinary tract infection    current, on antibiotic   Past Surgical History:  Procedure Laterality Date   CHOLECYSTECTOMY N/A 10/22/2012   Procedure: LAPAROSCOPIC CHOLECYSTECTOMY WITHOUT INTRAOPERATIVE CHOLANGIOGRAM;  Surgeon: Jerilynn Mages. Gerald Stabs, MD;  Location: [place];  Service: Pediatrics;  Laterality: N/A;   Family History: family history includes Cancer in her maternal aunt and maternal grandfather; Healthy in her father and mother; Hypertension in her maternal grandmother. Social History:  reports that she has never smoked. She has never used smokeless tobacco. She reports that she does not drink alcohol and does not use drugs.     Maternal Diabetes: No Genetic Screening: Normal Maternal Ultrasounds/Referrals: Normal Fetal Ultrasounds or other Referrals:  None Maternal Substance Abuse:  No Significant Maternal Medications:  None Significant Maternal Lab Results:  Group B Strep negative Number of Prenatal Visits:greater than 3 verified prenatal visits Other Comments:  None  Review of Systems  Respiratory: Negative.    Cardiovascular: Negative.    Maternal Medical History:  Reason for admission: Contractions.   Contractions: Frequency: regular.   Perceived severity is strong.   Fetal activity: Perceived fetal activity is normal.   Prenatal Complications - Diabetes:  none.   Dilation: 7 Effacement (%): 100 Station: -1 Exam by:: Glade Nurse, RNC Blood pressure 127/78, pulse (!) 120, temperature 99.2 F (37.3 C), temperature source Axillary, resp. rate 16, unknown if currently breastfeeding. Maternal Exam:  Uterine Assessment: Contraction strength is moderate.  Contraction frequency is regular.  Abdomen: Patient reports no abdominal tenderness. Fetal presentation: breech Introitus: Normal vulva. Normal vagina.  Amniotic fluid character: not assessed. Pelvis: adequate for delivery.     Fetal Exam Fetal Monitor Review: Mode: ultrasound.   Baseline rate: 140.  Variability: moderate (6-25 bpm).   Pattern: accelerations present and no decelerations.   Fetal State Assessment: Category I - tracings are normal.   Physical Exam Vitals reviewed.  Constitutional:      Appearance: Normal appearance.  Cardiovascular:     Rate and Rhythm: Normal rate and regular rhythm.  Pulmonary:     Effort: Pulmonary effort is normal. No respiratory distress.  Abdominal:     Palpations: Abdomen is soft.  Genitourinary:    General: Normal vulva.  Neurological:     Mental Status: She is alert.     Prenatal labs: ABO, Rh: --/--/PENDING (02/11 1035) Antibody: PENDING (02/11 1035) Rubella: Immune (09/05 0000) RPR: Nonreactive (09/05 0000)  HBsAg: Negative (09/05 0000)  HIV: Non-reactive (09/05 0000)  GBS: Negative/-- (01/19 0000)   Assessment/Plan: IUP at 39 weeks in active labor,  now 7 cm.  Found to be breech presentation, not frank breech.  Situation discussed, c-section recommended.  C-section procedure and risks discussed.  OR notified, will proceed when they are ready.   Debbie Acevedo Debbie Acevedo 07/20/2022, 11:28 AM

## 2022-07-20 NOTE — Addendum Note (Signed)
Addendum  created 07/20/22 1656 by Freddrick March, MD   Clinical Note Signed

## 2022-07-20 NOTE — MAU Provider Note (Addendum)
Pt informed that the ultrasound is considered a limited OB ultrasound and is not intended to be a complete ultrasound exam.  Patient also informed that the ultrasound is not being completed with the intent of assessing for fetal or placental anomalies or any pelvic abnormalities.  Explained that the purpose of today's ultrasound is to assess for presentation. Patient acknowledges the purpose of the exam and the limitations of the study.     Dilation: 5 Effacement (%): 60 Station: Ballotable Presentation: Vertex Exam by:: Keith Rake RN   Vertex position confirmed by Noni Saupe, NP both cervical exam and bedside US. Fetal sutures palpated.    Lezlie Lye, NP 07/20/2022 10:21 AM

## 2022-07-20 NOTE — MAU Note (Signed)
Debbie Acevedo is a 27 y.o. at 28w4dhere in MAU reporting: pt brought back from registration, straight to rm via wc.  No bleeding or leaking.  Contractions since 0700, very uncomfortable.  3rd baby.  Onset of complaint: 0700  There were no vitals filed for this visit.   FHT:134 Lab orders placed from triage:  none

## 2022-07-20 NOTE — Transfer of Care (Signed)
Immediate Anesthesia Transfer of Care Note  Patient: Debbie Acevedo  Procedure(s) Performed: CESAREAN SECTION (Abdomen)  Patient Location: PACU  Anesthesia Type:General  Level of Consciousness: awake, alert , and oriented  Airway & Oxygen Therapy: Patient Spontanous Breathing  Post-op Assessment: Report given to RN and Post -op Vital signs reviewed and stable  Post vital signs: Reviewed and stable  Last Vitals:  Vitals Value Taken Time  BP 116/47 07/20/22 1430  Temp    Pulse 115 07/20/22 1434  Resp 16 07/20/22 1434  SpO2 100 % 07/20/22 1434  Vitals shown include unvalidated device data.  Last Pain:  Vitals:   07/20/22 1012  TempSrc:   PainSc: 8          Complications: No notable events documented.

## 2022-07-20 NOTE — Anesthesia Procedure Notes (Addendum)
Epidural Patient location during procedure: OB Start time: 07/20/2022 12:15 PM End time: 07/20/2022 12:45 PM  Staffing Anesthesiologist: Freddrick March, MD Performed: anesthesiologist   Preanesthetic Checklist Completed: patient identified, IV checked, risks and benefits discussed, monitors and equipment checked, pre-op evaluation and timeout performed  Epidural Patient position: sitting Prep: DuraPrep and site prepped and draped Patient monitoring: continuous pulse ox, blood pressure, heart rate and cardiac monitor Approach: midline Location: L3-L4 Injection technique: LOR air  Needle:  Needle type: Tuohy  Needle gauge: 17 G Needle length: 9 cm Needle insertion depth: 10 cm Catheter type: closed end flexible Catheter size: 19 Gauge Catheter at skin depth: 16 cm Test dose: negative  Assessment Sensory level: T8 Events: blood not aspirated, no cerebrospinal fluid, injection not painful, no injection resistance, no paresthesia and negative IV test  Additional Notes Patient identified. Risks/Benefits/Options discussed with patient including but not limited to bleeding, infection, nerve damage, paralysis, failed block, incomplete pain control, headache, blood pressure changes, nausea, vomiting, reactions to medication both or allergic, itching and postpartum back pain. Confirmed with bedside nurse the patient's most recent platelet count. Confirmed with patient that they are not currently taking any anticoagulation, have any bleeding history or any family history of bleeding disorders. Patient expressed understanding and wished to proceed. All questions were answered. Sterile technique was used throughout the entire procedure. Please see nursing notes for vital signs. Test dose was given through epidural catheter and negative prior to continuing to dose epidural or start infusion. Warning signs of high block given to the patient including shortness of breath, tingling/numbness in  hands, complete motor block, or any concerning symptoms with instructions to call for help. Patient was given instructions on fall risk and not to get out of bed. All questions and concerns addressed with instructions to call with any issues or inadequate analgesia.    Multiple attempts to obtain spinal anesthesia. Attempts at L2/3, L3/4 and L4/5. Final attempts were with 17G Tuohy at L3/4. LOR at 10cm. Epidural catheter inserted without difficulty. Aspiration negative. The epidural was dosed as per chart. Pt reported decreased sensation to temperature and pinprick; however, felt pain with Allis. After discussion with the patient, the decision was made to proceed with general anesthesia. Induction and intubation without difficulty. Reason for block:procedure for pain

## 2022-07-20 NOTE — Anesthesia Preprocedure Evaluation (Addendum)
Anesthesia Evaluation  Patient identified by MRN, date of birth, ID band Patient awake    Reviewed: Allergy & Precautions, NPO status , Patient's Chart, lab work & pertinent test results  Airway Mallampati: II  TM Distance: >3 FB Neck ROM: Full    Dental no notable dental hx.    Pulmonary neg pulmonary ROS   Pulmonary exam normal breath sounds clear to auscultation       Cardiovascular Hypertension: PIH. Normal cardiovascular exam Rhythm:Regular Rate:Normal     Neuro/Psych negative neurological ROS  negative psych ROS   GI/Hepatic negative GI ROS, Neg liver ROS,,,  Endo/Other    Morbid obesity (BMI 42)  Renal/GU negative Renal ROS  negative genitourinary   Musculoskeletal negative musculoskeletal ROS (+)    Abdominal   Peds  Hematology negative hematology ROS (+)   Anesthesia Other Findings Presents for primary C/S for breech  Reproductive/Obstetrics (+) Pregnancy                             Anesthesia Physical Anesthesia Plan  ASA: 3  Anesthesia Plan: Spinal   Post-op Pain Management:    Induction:   PONV Risk Score and Plan: Treatment may vary due to age or medical condition  Airway Management Planned: Natural Airway  Additional Equipment:   Intra-op Plan:   Post-operative Plan:   Informed Consent: I have reviewed the patients History and Physical, chart, labs and discussed the procedure including the risks, benefits and alternatives for the proposed anesthesia with the patient or authorized representative who has indicated his/her understanding and acceptance.     Dental advisory given  Plan Discussed with: CRNA  Anesthesia Plan Comments:        Anesthesia Quick Evaluation

## 2022-07-20 NOTE — Op Note (Signed)
Preoperative diagnosis: Intrauterine pregnancy at 39 weeks, active labor, breech presentation Postoperative diagnosis: Intrauterine pregnancy at 39 weeks, breech presentation Procedure: Primary low transverse cesarean section without extensions Surgeon: Cheri Fowler M.D. Anesthesia: Failed epidural/spinal, GETA Findings: Patient had normal gravid anatomy and delivered a viable female infant with Apgars of 8 and 9, weight pending at the time of dictation Estimated blood loss: 608 cc Specimens: Placenta to L&D Complications: None  Procedure in detail: Patient was taken to the operating room and placed in the sitting position. Multiple attempts were made to instill spinal anesthesia and place an epidural.  An epidural was eventually placed. She was then placed in the dorsosupine position with left lateral tilt. Abdomen was prepped and draped in the usual sterile fashion and a Foley catheter was inserted. The level of her anesthesia was found to be inadequate, so general anesthesia was induced. Abdomen was entered via a standard Pfannenstiel incision without difficulty. The Alexis disposable self-retaining retractor was placed and good visualization was achieved. A 4 cm transverse incision was then made in the lower uterine segment pushing the bladder inferior. Once the uterine cavity was entered the incision was extended bilaterally digitally. Membranes ruptured revealing clear fluid. The fetal breech was at the incision and was delivered atraumatically. Legs, abdomen and chest delivered easily. Arms were swept across the chest and head delivered easily. Mouth and nares were suctioned and the cord was doubly clamped and cut and the infant handed to the awaiting pediatric team. Cord blood was obtained. The placenta delivered spontaneously. Uterus was wiped dry with clean lap pad and all clots and debris were removed. The uterine incision was inspected and found to be free of extensions. Uterine incision was  then closed in 1 layer with running locking layer with #1 chromic, 2 figure 8 sutures of #1 Chromic used for hemostasis. This achieved adequate closure and hemostasis. Tubes and ovaries were inspected and found to be normal. Uterine incision was again inspected and found to be hemostatic. The Alexis retractor was removed. Subfascial space was irrigated and made hemostatic with electrocautery. Fascia was closed in running fashion starting at both ends and meeting in the middle with 0 Vicryl. Subcutaneous tissue was then irrigated and made hemostatic with electrocautery, then closed with 2-0 plain gut. Skin was closed with running subcuticular suture of 4-0 Vicryl followed by Steri-Strips with benzoin. Patient tolerated procedure well and was taken to the recovery in stable condition. Baby stayed with mom throughout the procedure. Counts were correct x2, she received Ancef 2 g IV and zithromax 500 mg IV at the beginning of the procedure and she had PAS hose on throughout the procedure.

## 2022-07-20 NOTE — Anesthesia Procedure Notes (Signed)
Procedure Name: Intubation Date/Time: 07/20/2022 1:24 PM  Performed by: Hewitt Blade, CRNAPre-anesthesia Checklist: Patient identified, Emergency Drugs available, Suction available and Patient being monitored Patient Re-evaluated:Patient Re-evaluated prior to induction Oxygen Delivery Method: Circle system utilized Preoxygenation: Pre-oxygenation with 100% oxygen Induction Type: IV induction, Rapid sequence and Cricoid Pressure applied Laryngoscope Size: Glidescope Grade View: Grade I Tube type: Oral Tube size: 7.0 mm Number of attempts: 1 Airway Equipment and Method: Rigid stylet Placement Confirmation: ETT inserted through vocal cords under direct vision, positive ETCO2 and breath sounds checked- equal and bilateral Secured at: 21 cm Tube secured with: Tape Dental Injury: Teeth and Oropharynx as per pre-operative assessment

## 2022-07-20 NOTE — Lactation Note (Signed)
This note was copied from a baby's chart. Lactation Consultation Note  Patient Name: Girl Larae Barahona [id number] Date: 07/20/2022 Reason for consult: Initial assessment;Term Age:27 hours Mom BF when LC came in rm. Baby came off not long after LC came in rm. Mom stated BF going well. Baby a little sweaty d/t mom having baby swaddled and having lots of cover in bed. Left baby's hat off.  Answered mom's questions. Newborn feeding habits, STS, I&O, reviewed. Mom encouraged to feed baby 8-12 times/24 hours and with feeding cues.  Mom feels that BF going well but would like to be seen tomorrow in am 07/21/2022. Suggested call when she needs assistance or has questions.   Maternal Data Does the patient have breastfeeding experience prior to this delivery?: Yes How long did the patient breastfeed?: 3 months to her now 27 yr old and 4 months to her now 27 yr old almost 27 yrs old.  Feeding    LATCH Score Latch: Grasps breast easily, tongue down, lips flanged, rhythmical sucking.  Audible Swallowing: A few with stimulation  Type of Nipple: Everted at rest and after stimulation  Comfort (Breast/Nipple): Soft / non-tender  Hold (Positioning): No assistance needed to correctly position infant at breast.  LATCH Score: 9   Lactation Tools Discussed/Used    Interventions    Discharge    Consult Status Consult Status: Follow-up Date: 07/21/22 Follow-up type: In-patient    Theodoro Kalata 07/20/2022, 11:23 PM

## 2022-07-20 NOTE — Progress Notes (Signed)
Patient ID: Debbie Acevedo, female   DOB: [birth year], 27 y.o.   MRN: [id number] Bedside US by me shows breech presentation likely footling in active labor. Dr. Willis Modena is aware and requested to prepare for cesarean section. The risks of surgery were discussed with the patient including but were not limited to: bleeding which may require transfusion or reoperation; infection which may require antibiotics; injury to bowel, bladder, ureters or other surrounding organs; injury to the fetus; need for additional procedures including hysterectomy in the event of a life-threatening hemorrhage; formation of adhesions; placental abnormalities with subsequent pregnancies; incisional problems; thromboembolic phenomenon and other postoperative/anesthesia complications.  The patient concurred with the proposed plan, giving informed written consent for the procedure.   Patient has been NPO since 2000 she will remain NPO for procedure. Anesthesia and OR aware. Preoperative prophylactic antibiotics and SCDs ordered on call to the OR.  To OR when ready.  Woodroe Mode, MD 07/20/2022 11:24 AM

## 2022-07-20 NOTE — Anesthesia Postprocedure Evaluation (Signed)
Anesthesia Post Note  Patient: Debbie Acevedo  Procedure(s) Performed: CESAREAN SECTION (Abdomen)     Patient location during evaluation: PACU Anesthesia Type: General Level of consciousness: awake and alert Pain management: pain level controlled Vital Signs Assessment: post-procedure vital signs reviewed and stable Respiratory status: spontaneous breathing, nonlabored ventilation, respiratory function stable and patient connected to nasal cannula oxygen Cardiovascular status: blood pressure returned to baseline and stable Postop Assessment: no apparent nausea or vomiting Anesthetic complications: no  No notable events documented.  Last Vitals:  Vitals:   07/20/22 1600 07/20/22 1615  BP: (!) 116/57 (!) 120/56  Pulse: (!) 107 (!) 110  Resp: (!) 9   Temp:  36.8 C  SpO2: 100% 98%    Last Pain:  Vitals:   07/20/22 1615  TempSrc: Oral  PainSc: 2    Pain Goal:                Epidural/Spinal Function Cutaneous sensation: Able to Wiggle Toes (07/20/22 1615), Patient able to flex knees: Yes (07/20/22 1615), Patient able to lift hips off bed: No (07/20/22 1615), Back pain beyond tenderness at insertion site: No (07/20/22 1600), Progressively worsening motor and/or sensory loss: No (07/20/22 1600), Bowel and/or bladder incontinence post epidural: No (07/20/22 1600)  [place]

## 2022-07-21 ENCOUNTER — Encounter (HOSPITAL_COMMUNITY): Payer: Self-pay | Admitting: Obstetrics and Gynecology

## 2022-07-21 LAB — CBC
HCT: 29.8 % — ABNORMAL LOW (ref 36.0–46.0)
Hemoglobin: 10.3 g/dL — ABNORMAL LOW (ref 12.0–15.0)
MCH: 29.7 pg (ref 26.0–34.0)
MCHC: 34.6 g/dL (ref 30.0–36.0)
MCV: 85.9 fL (ref 80.0–100.0)
Platelets: 269 10*3/uL (ref 150–400)
RBC: 3.47 MIL/uL — ABNORMAL LOW (ref 3.87–5.11)
RDW: 14.4 % (ref 11.5–15.5)
WBC: 25.7 10*3/uL — ABNORMAL HIGH (ref 4.0–10.5)
nRBC: 0 % (ref 0.0–0.2)

## 2022-07-21 MED ORDER — RHO D IMMUNE GLOBULIN 1500 UNIT/2ML IJ SOSY
300.0000 ug | PREFILLED_SYRINGE | Freq: Once | INTRAMUSCULAR | Status: AC
  Administered 2022-07-21: 300 ug via INTRAVENOUS
  Filled 2022-07-21: qty 2

## 2022-07-21 NOTE — Lactation Note (Signed)
This note was copied from a baby's chart. Lactation Consultation Note  Patient Name: Debbie Acevedo [id number] Date: 07/21/2022   Age:27 hours Attempted to see mom but she was sleeping. FOB caring for baby. Maternal Data    Feeding    LATCH Score                    Lactation Tools Discussed/Used    Interventions    Discharge    Consult Status      Theodoro Kalata 07/21/2022, 9:06 PM

## 2022-07-21 NOTE — Progress Notes (Signed)
Patient ambulated very slowly/stiffly to bathroom.  Took 2 people to assist.   Patient reluctant to walk but did ambulate to toliet.  Did peri/foley care.  Patient requests to leave foley u cath in longer.  Patient does not feel like she will be able to ambulate to toliet in time if foley removed at this time.  Explained why we usually remove foley u cath as soon as possible, patient acknowledges reasoning, understands the cons to leaving the foley in longer then usual.  Patient also ambulated with pillow clutched to her abdomen, requests abdominal binder.  Explained to her that we need a doctor's order for the binder, that I will facilitate her request.   On the way back to bed the patient did ambulate/move easier.  Encouraged her to call out for help for ambulation.  Explained increasing ambulation will facilitate her healing.

## 2022-07-21 NOTE — Social Work (Signed)
CSW received consult for Edinburgh Postnatal Depression Screening score of 21 and answered something to question 10.  CSW met with MOB to offer support and complete assessment. CSW entered the room and observed MOB  resting in bed, FOB at bedside sleeping and the infant lying comfortable in the bassinet. CSW introduced self, CSW role and reason for visit MOB was agreeable to visit and allowed FOB to remain in the room. MOB was engaged and pleasant throughout the assessment. CSW inquired about how MOB was feeling, MOB reported she was okay but feeling a little pain. CSW inquired about MOB EPDS results, MOB reported she has been anxious and overwhelmed throughout the entire pregnancy but specific to the last 7 days MOB reported she has not been as anxious and overwhelmed. MOB reported taking care of other children while being pregnant put a mental strain on her. CSW normalized MOB's feelings. CSW inquired about MOB answering something to "The thought of harming myself has occurred to me" MOB reported about 3 weeks ago she was feeling overwhelmed,while  driving MOB reported she closed her eyes and felt herself left go of the wheel MOB reported she then "came back to"  and grabbed the wheel, MOB reported she has never had this feeling before and has not experienced it since. CSW assessed for current safety, MOB denied any SI or HI. MOB identified FOB, her mom and sister as her supports. CSW inquired about hx of PPD, MOB reported she experienced Baby blues for 3-4 days after her last child. CSW provided education regarding the baby blues period vs. perinatal mood disorders, discussed treatment and gave resources for mental health follow up if concerns arise.  CSW recommends self-evaluation during the postpartum time period using the New Mom Checklist from Postpartum Progress and encouraged MOB to contact a medical professional if symptoms are noted at any time.  MOB reported she plans to start Zoloft PP and has a  referral to a therapist she just has to follow up. CSW encouraged MOB to follow up.  CSW provided review of Sudden Infant Death Syndrome (SIDS) precautions.  MOB identified [place] for infants follow up care. MOB  reported she has all necessary items for the infant including a bassinet for her to sleep. CSW identifies no further need for intervention and no barriers to discharge at this time.  Letta Kocher, [place] Social Worker [phone]

## 2022-07-21 NOTE — Progress Notes (Signed)
Subjective: Postpartum Day 1: Cesarean Delivery Doing well, pain controlled. Has ambulated once, now getting up to BR and ready to take out foley. Tolerating PO. Lochia appropriate.   Objective: Patient Vitals for the past 24 hrs:  BP Temp Temp src Pulse Resp SpO2  07/21/22 0401 112/63 98.6 F (37 C) Oral 91 18 97 %  07/21/22 0100 (!) 112/53 98.4 F (36.9 C) Oral (!) 107 18 97 %  07/20/22 1927 116/72 98.5 F (36.9 C) Oral 96 18 95 %  07/20/22 1824 116/62 -- -- (!) 106 16 98 %  07/20/22 1712 119/76 98.1 F (36.7 C) Oral (!) 112 18 97 %  07/20/22 1615 (!) 120/56 98.3 F (36.8 C) Oral (!) 110 16 98 %  07/20/22 1600 (!) 116/57 -- -- (!) 107 (!) 9 100 %  07/20/22 1545 111/74 98.2 F (36.8 C) Oral (!) 108 11 100 %  07/20/22 1537 -- -- -- (!) 109 11 100 %  07/20/22 1536 110/74 -- -- (!) 105 10 100 %  07/20/22 1530 (!) 79/60 -- -- (!) 121 18 96 %  07/20/22 1515 108/69 -- -- 100 (!) 22 100 %  07/20/22 1510 -- -- -- (!) 103 (!) 21 100 %  07/20/22 1505 -- -- -- (!) 106 18 100 %  07/20/22 1500 102/67 98.3 F (36.8 C) Oral (!) 108 (!) 21 99 %  07/20/22 1455 -- -- -- (!) 107 20 100 %  07/20/22 1450 -- -- -- (!) 110 20 100 %  07/20/22 1445 92/72 -- -- (!) 115 19 100 %  07/20/22 1440 -- -- -- (!) 114 10 100 %  07/20/22 1435 -- -- -- (!) 114 14 100 %  07/20/22 1430 (!) 116/47 98.5 F (36.9 C) Oral (!) 117 16 100 %  07/20/22 1017 127/78 -- -- (!) 120 16 --  07/20/22 1003 133/86 99.2 F (37.3 C) Axillary (!) 131 16 --     Physical Exam:  General: alert, cooperative, and no distress Lochia: appropriate Uterine Fundus: firm Incision: healing well, no significant drainage, no dehiscence, no significant erythema DVT Evaluation: No evidence of DVT seen on physical exam.  Recent Labs    07/20/22 1036 07/21/22 0525  HGB 11.7* 10.3*  HCT 33.8* 29.8*    Assessment/Plan:  Debbie Acevedo [id number] POD#1 sp primary cesarean at 103w4dfor breech, in labor 1. PPC: routine care, follow up  void 2.  Rh neg, baby Rh pos: follow up rhogam 3. Leukocytosis: afebrile, no signs of infection at this time. Will monitor and repeat WBC tomorrow AM.   MRowland Lathe2/05/2023, 9:05 AM

## 2022-07-22 LAB — CBC WITH DIFFERENTIAL/PLATELET
Abs Immature Granulocytes: 0.09 10*3/uL — ABNORMAL HIGH (ref 0.00–0.07)
Basophils Absolute: 0.1 10*3/uL (ref 0.0–0.1)
Basophils Relative: 0 %
Eosinophils Absolute: 0.2 10*3/uL (ref 0.0–0.5)
Eosinophils Relative: 1 %
HCT: 26.5 % — ABNORMAL LOW (ref 36.0–46.0)
Hemoglobin: 8.6 g/dL — ABNORMAL LOW (ref 12.0–15.0)
Immature Granulocytes: 1 %
Lymphocytes Relative: 19 %
Lymphs Abs: 3.1 10*3/uL (ref 0.7–4.0)
MCH: 28.8 pg (ref 26.0–34.0)
MCHC: 32.5 g/dL (ref 30.0–36.0)
MCV: 88.6 fL (ref 80.0–100.0)
Monocytes Absolute: 1 10*3/uL (ref 0.1–1.0)
Monocytes Relative: 7 %
Neutro Abs: 11.4 10*3/uL — ABNORMAL HIGH (ref 1.7–7.7)
Neutrophils Relative %: 72 %
Platelets: 251 10*3/uL (ref 150–400)
RBC: 2.99 MIL/uL — ABNORMAL LOW (ref 3.87–5.11)
RDW: 14.8 % (ref 11.5–15.5)
WBC: 15.8 10*3/uL — ABNORMAL HIGH (ref 4.0–10.5)
nRBC: 0 % (ref 0.0–0.2)

## 2022-07-22 LAB — RH IG WORKUP (INCLUDES ABO/RH)
Fetal Screen: NEGATIVE
Gestational Age(Wks): 39.4
Unit division: 0

## 2022-07-22 MED ORDER — SERTRALINE HCL 50 MG PO TABS
50.0000 mg | ORAL_TABLET | Freq: Every day | ORAL | Status: DC
  Administered 2022-07-22: 50 mg via ORAL
  Filled 2022-07-22: qty 1

## 2022-07-22 NOTE — Lactation Note (Signed)
This note was copied from a baby's chart. Lactation Consultation Note  Patient Name: Debbie Acevedo [id number] Date: 07/22/2022   Age:27 hours    Mother was eligible for a personal DEBP through [place]. Pump given to patient.  Stana Bunting M 07/22/2022, 4:11 PM

## 2022-07-22 NOTE — Lactation Note (Signed)
This note was copied from a baby's chart. Lactation Consultation Note  Patient Name: Debbie Acevedo [id number] Date: 07/22/2022  Reason for consult: Follow-up assessment;Term;Difficult latch;Infant weight loss  Age:27 hours P3, 9% weight loss  Mother reports baby is having brief feedings and not maintaining her latch, per mom " coming on and off the breast".   Assisted mother with positioning and infant attachment to breast. Baby is awake and rooting. She is latching shallow and thrusting nipple out of her mouth and then eagerly trying to re-latch. Baby is showing lots of efforts to feed. Hand expression reviewed prior to latch and a glistening of colostrum noted. Mother agreeable to pumping and DEBP set up with instructions regarding use, frequency, cleaning and storage. Mother is having concerns about infant's weight loss. After discussing options, mother would like to supplement breastfeeding with formula given by bottle. Declines donor breast milk.   Mother attempted to feed baby with formula bottle and slow flow nipple with plans of paced feeding. Infant immediately displayed a loud clap-like noise and gulps.She did not have distress or cyanosis but feeding was stopped. Contact was made with Family Practice MD and order for SLP evaluation was placed by RN. Called and updated SLP and they will see baby today. Mother withholding bottle feeding until SLP can observe a bottle feeding.     Feeding Mother's Current Feeding Choice: Breast Milk  LATCH Score Latch: Repeated attempts needed to sustain latch, nipple held in mouth throughout feeding, stimulation needed to elicit sucking reflex.  Audible Swallowing: None  Type of Nipple: Everted at rest and after stimulation  Comfort (Breast/Nipple): Soft / non-tender  Hold (Positioning): Assistance needed to correctly position infant at breast and maintain latch.  LATCH Score: 6   Interventions Interventions: Breast feeding basics  reviewed;Assisted with latch;Skin to skin;Hand express;Breast compression;Adjust position;Support pillows;DEBP;Education  Discharge Pump: Manual  Consult Status Consult Status: Follow-up Date: 07/23/22 Follow-up type: In-patient   Stana Bunting M 07/22/2022, 11:37 AM

## 2022-07-22 NOTE — Progress Notes (Signed)
Subjective: Postpartum Day 2: Cesarean Delivery Doing well, pain controlled. Tolerating PO, lochia appropriate. Abdominal pain improved from yesterday after gas meds and ambulation. Desires to start Zoloft at this time and to have pump parts to aid in breast milk  Objective: Patient Vitals for the past 24 hrs:  BP Temp Temp src Pulse Resp SpO2  07/22/22 0542 115/65 98 F (36.7 C) Oral 88 18 97 %  07/21/22 2257 (!) 103/52 98.1 F (36.7 C) Oral 86 18 99 %  07/21/22 1821 114/72 98 F (36.7 C) Oral (!) 103 17 100 %      Physical Exam:  General: alert, cooperative, and no distress Lochia: appropriate Uterine Fundus: firm Incision: healing well, no significant drainage, no dehiscence, no significant erythema DVT Evaluation: No evidence of DVT seen on physical exam.  Recent Labs    07/21/22 0525 07/22/22 0717  HGB 10.3* 8.6*  HCT 29.8* 26.5*     Assessment/Plan:  Debbie Acevedo [id number] POD#2 sp primary cesarean at 68w4dfor breech, in labor 1. PPC: routine care, encourage ambulation and IS 2.  Rh neg, baby Rh pos: follow up rhogam 3. Leukocytosis: improved from yesterday, afebrile, no signs of infection 4. Elevated EPDS - planning to restart Zoloft today  SDeliah Boston2/13/2024, 9:01 AM

## 2022-07-23 MED ORDER — SERTRALINE HCL 50 MG PO TABS: 50.0000 mg | ORAL_TABLET | Freq: Every day | ORAL | 3 refills | Status: DC

## 2022-07-23 MED ORDER — OXYCODONE HCL 5 MG PO TABS: 5.0000 mg | ORAL_TABLET | Freq: Four times a day (QID) | ORAL | 0 refills | Status: DC | PRN

## 2022-07-23 MED ORDER — IBUPROFEN 600 MG PO TABS: 600.0000 mg | ORAL_TABLET | Freq: Four times a day (QID) | ORAL | 0 refills | Status: DC

## 2022-07-23 NOTE — Progress Notes (Signed)
Patient is doing well.  She is tolerating PO, ambulating, voiding.  Pain is controlled.  Lochia is appropriate.  Feeling much better today after her unplanned c/s.  Vitals:   07/22/22 0542 07/22/22 1242 07/22/22 2200 07/23/22 0500  BP: 115/65 127/60 131/62 (!) 106/54  Pulse: 88 (!) 103 (!) 120 87  Resp: 18 19 18 18  $ Temp: 98 F (36.7 C) 98.3 F (36.8 C) 98.3 F (36.8 C) 98 F (36.7 C)  TempSrc: Oral Oral Oral Oral  SpO2: 97% 100% 98% 100%    NAD Abdomen:  soft, appropriate tenderness, incisions intact and without erythema or drainage ext:    Symmetric, trace edema bilaterally  Lab Results  Component Value Date   WBC 15.8 (H) 07/22/2022   HGB 8.6 (L) 07/22/2022   HCT 26.5 (L) 07/22/2022   MCV 88.6 07/22/2022   PLT 251 07/22/2022    --/--/B NEG (02/11 1035)/  A/P    27 y.o. G3P3003 POD #1 s/p primary c/s for breech presentation (spontaneous version in labor) Routine post op and postpartum care.   Rh negative: s/p rhogam on 2/12 Discharge on zoloft--restarted PP  Meeting all goals.  Discharge to home today.

## 2022-07-23 NOTE — Lactation Note (Signed)
This note was copied from a baby's chart. Lactation Consultation Note  Patient Name: Debbie Acevedo [id number] Date: 07/23/2022 Age : 27 hours old  Reason for consult: Follow-up assessment;Infant weight loss;Term (10 % weight loss,) M - B- and baby B+  LC reviewed and updated the doc flow sheets per mom. The output correlates with the weight loss. Mom has been supplementing with formula.  Per mom the baby recently fed @ 0725 - 26 mins and breast are fuller and hearing more swallows.  Mom denies soreness.  LC reviewed Bf D/C teaching and the [facility] resources.   Maternal Data Has patient been taught Hand Expression?:  (per mom breast are fuller and milk feels like its coming in)  Feeding Mother's Current Feeding Choice: Breast Milk and Formula  LATCH Score   Lactation Tools Discussed/Used Tools: Pump (DEBP had already been set up) Breast pump type: Double-Electric Breast Pump Pump Education: Milk Storage  Interventions Interventions: Breast feeding basics reviewed;Hand pump;DEBP;Education;LC Services brochure  Discharge Discharge Education: Engorgement and breast care;Warning signs for feeding baby Pump: Manual;DEBP;Personal (per mom [place]) WIC Program: No  Consult Status Consult Status: Complete Date: 07/23/22    Myer Haff 07/23/2022, 8:40 AM

## 2022-07-23 NOTE — Discharge Instructions (Signed)
Ibuprofen 600 mg and acetaminophen 1000 mg (two extra strength tylenol) every 6 hours  Add the oxycodone 5 mg only as needed for severe pain.  If you take the oxycodone, please add a dose of miralax daily for constipation.  Please add an over the counter iron tablet (ferrous sulfate) every other day for mild anemia

## 2022-07-23 NOTE — Discharge Summary (Addendum)
Postpartum Discharge Summary  Date of Service updated 2/14     Patient Name: Debbie Acevedo DOB: [birth year] MRN: [id number]  Date of admission: 07/20/2022 Delivery date:07/20/2022  Delivering provider: Willis Modena, TODD  Date of discharge: 07/23/2022  Admitting diagnosis: Indication for care in labor or delivery [O75.9] Breech presentation [O32.1XX0] Intrauterine pregnancy: [redacted]w[redacted]d    Secondary diagnosis:  Principal Problem:   Indication for care in labor or delivery Active Problems:   Breech presentation  Additional problems: spontaneous version to breech in labor    Discharge diagnosis: Term Pregnancy Delivered                                              Post partum procedures: n/a Augmentation: N/A Complications: None  Hospital course: Onset of Labor With Unplanned C/S   27y.o. yo G3P3003 at 317w4das admitted in Active Labor on 07/20/2022. She was 5 cm on admission and cephalic by [place] An hour later, she was noted to have spontaneously verted to a breech presentation. The patient went for cesarean section due to Malpresentation. Delivery details as follows: Membrane Rupture Time/Date: 1:31 PM ,07/20/2022   Delivery Method:C-Section, Low Transverse  Details of operation can be found in separate operative note. Patient had a postpartum course complicated by elevated EPDS.  She was started on sertraline on POD#2.  She is ambulating,tolerating a regular diet, passing flatus, and urinating well.  Patient is discharged home in stable condition 07/23/22.  Newborn Data: Birth date:07/20/2022  Birth time:1:31 PM  Gender:Female  Living status:Living  Apgars:8 ,9  Weight:3780 g    Physical exam  Vitals:   07/22/22 0542 07/22/22 1242 07/22/22 2200 07/23/22 0500  BP: 115/65 127/60 131/62 (!) 106/54  Pulse: 88 (!) 103 (!) 120 87  Resp: 18 19 18 18  $ Temp: 98 F (36.7 C) 98.3 F (36.8 C) 98.3 F (36.8 C) 98 F (36.7 C)  TempSrc: Oral Oral Oral Oral  SpO2: 97% 100% 98% 100%    General: alert, cooperative, and no distress Lochia: appropriate Uterine Fundus: firm Incision: Healing well with no significant drainage DVT Evaluation: No evidence of DVT seen on physical exam. Labs: Lab Results  Component Value Date   WBC 15.8 (H) 07/22/2022   HGB 8.6 (L) 07/22/2022   HCT 26.5 (L) 07/22/2022   MCV 88.6 07/22/2022   PLT 251 07/22/2022      Latest Ref Rng & Units 07/11/2019    2:43 PM  CMP  Glucose 70 - 99 mg/dL 81   BUN 6 - 20 mg/dL <5   Creatinine 0.44 - 1.00 mg/dL 0.66   Sodium 135 - 145 mmol/L 136   Potassium 3.5 - 5.1 mmol/L 3.7   Chloride 98 - 111 mmol/L 105   CO2 22 - 32 mmol/L 21   Calcium 8.9 - 10.3 mg/dL 8.7   Total Protein 6.5 - 8.1 g/dL 5.7   Total Bilirubin 0.3 - 1.2 mg/dL 0.5   Alkaline Phos 38 - 126 U/L 118   AST 15 - 41 U/L 16   ALT 0 - 44 U/L 12    Edinburgh Score:    07/21/2022    4:01 AM  Edinburgh Postnatal Depression Scale Screening Tool  I have been able to laugh and see the funny side of things. 1  I have looked forward with enjoyment to things. 0  I have blamed myself unnecessarily when things went wrong. 3  I have been anxious or worried for no good reason. 3  I have felt scared or panicky for no good reason. 3  Things have been getting on top of me. 3  I have been so unhappy that I have had difficulty sleeping. 2  I have felt sad or miserable. 2  I have been so unhappy that I have been crying. 2  The thought of harming myself has occurred to me. 2  Edinburgh Postnatal Depression Scale Total 21      After visit meds:  Allergies as of 07/23/2022   No Known Allergies      Medication List     TAKE these medications    ibuprofen 600 MG tablet Commonly known as: ADVIL Take 1 tablet (600 mg total) by mouth every 6 (six) hours.   multivitamin-prenatal 27-0.8 MG Tabs tablet Take 1 tablet by mouth daily at 12 noon.   oxyCODONE 5 MG immediate release tablet Commonly known as: Oxy IR/ROXICODONE Take 1 tablet (5 mg  total) by mouth every 6 (six) hours as needed for severe pain.   sertraline 50 MG tablet Commonly known as: ZOLOFT Take 1 tablet (50 mg total) by mouth at bedtime.               Discharge Care Instructions  (From admission, onward)           Start     Ordered   07/23/22 0000  No dressing needed        07/23/22 1149             Discharge home in stable condition Infant Feeding: Bottle and Breast Infant Disposition:home with mother Discharge instruction: per After Visit Summary and Postpartum booklet. Activity: Advance as tolerated. Pelvic rest for 6 weeks.  Diet: routine diet Anticipated Birth Control: Nexplanon Postpartum Appointment:4 weeks Additional Postpartum F/U:  n/a Future Appointments:No future appointments. Follow up Visit:  Follow-up Information     Ob/Gyn, [facility] Follow up in 4 week(s).   Why: 2 week mood check, 4 week routine postpartum visit Contact information: [place] [address] [phone]                     07/23/2022 [facility] Lars Masson, MD

## 2022-07-25 ENCOUNTER — Inpatient Hospital Stay (HOSPITAL_COMMUNITY): Payer: Medicaid Other

## 2022-07-25 ENCOUNTER — Inpatient Hospital Stay (HOSPITAL_COMMUNITY)
Admission: RE | Admit: 2022-07-25 | Payer: Medicaid Other | Source: Home / Self Care | Admitting: Obstetrics and Gynecology

## 2022-07-31 ENCOUNTER — Telehealth (HOSPITAL_COMMUNITY): Payer: Self-pay | Admitting: *Deleted

## 2022-07-31 NOTE — Telephone Encounter (Signed)
Left phone voicemail message.  Odis Hollingshead, RN 07-31-2022 at 1:28pm

## 2022-08-15 ENCOUNTER — Ambulatory Visit: Payer: Self-pay

## 2022-08-15 NOTE — Lactation Note (Signed)
This note was copied from a baby's chart. Lactation Consultation Note  Patient Name: Debbie Acevedo [id number] Date: 08/15/2022 Age:27 wk.o. Reason for consult: Term;Breastfeeding assistance;Mother's request;Initial assessment  LC entered the room and the birth parent was holding the infant.  She stated that she had recently pumped and only gotten about 1oz.  She said that she had not had anything to drink all day.  The birth parent commented that she typically pumps between 2-3 oz.  Per the birth parent she has been feeding the infant at the breast at night and pumping and bottle feeding during the day.  She stated that the infant has been taking in about 2-2.5 oz when being bottle fed.  LC encouraged the birth parent to try increasing her protein intake to increase the fat content of her milk.  LC also encouraged the birth parent to put the infant to the breast per the order of the SLP & MD, but supplement after feedings with 1oz as needed.  [name] asked the birth parent if the MD had asked her to increase the amount that she was feeding the infant.  The birth parent stated that she had not been told to increase the amount that she was feeding the infant.  The birth parent said she would ask the MD.  The birth parent is aware to keep total feedings <30.  She is also aware that she should feed the infant every 3 hours rather than every 2 hours.  All questions were answered.    Feeding Mother's Current Feeding Choice: Breast Milk and Formula   Interventions Interventions: Education  Discharge   Consult Status Consult Status: Follow-up Date: 08/16/22 Follow-up type: In-patient   Lysbeth Penner 08/15/2022, 7:10 PM

## 2022-08-16 ENCOUNTER — Ambulatory Visit: Payer: Self-pay

## 2022-08-16 NOTE — Lactation Note (Signed)
This note was copied from a baby's chart. Lactation Consultation Note  Patient Name: Debbie Acevedo [id number] Date: 08/16/2022 Age:27 wk.o. Reason for consult: Follow-up assessment;Term;Breastfeeding assistance  LC entered the room and the birth parent was holding the infant.  She stated that the infant has been feeding well. The birth parent said that she has been feeding the infant at the breast, pumping, and feeding the infant formula.  She commented that the infant has gained weight according to the MD.  [place] asked the birth parent if she was clear on the feeding plan.  She stated that she had been given clear guidelines on how much formula to give the infant (3.5 oz) and how much breast milk to give her.  The birth parent stated that she had no further questions or concerns.   Consult Status Consult Status: Complete Date: 08/16/22 Follow-up type: Call as needed    Lysbeth Penner 08/16/2022, 4:05 PM

## 2022-08-30 ENCOUNTER — Other Ambulatory Visit: Payer: Self-pay

## 2022-08-30 ENCOUNTER — Ambulatory Visit (HOSPITAL_COMMUNITY)
Admission: RE | Admit: 2022-08-30 | Discharge: 2022-08-30 | Disposition: A | Discharge status: Home / Self Care | Payer: Medicaid Other | Source: Ambulatory Visit | Attending: Physician Assistant | Admitting: Physician Assistant

## 2022-08-30 ENCOUNTER — Encounter (HOSPITAL_COMMUNITY): Payer: Self-pay

## 2022-08-30 VITALS — BP 103/71 | HR 96 | Temp 98.3°F | Resp 20

## 2022-08-30 DIAGNOSIS — Z1152 Encounter for screening for COVID-19: Secondary | ICD-10-CM | POA: Diagnosis not present

## 2022-08-30 DIAGNOSIS — J069 Acute upper respiratory infection, unspecified: Secondary | ICD-10-CM | POA: Diagnosis present

## 2022-08-30 DIAGNOSIS — J209 Acute bronchitis, unspecified: Secondary | ICD-10-CM

## 2022-08-30 DIAGNOSIS — R062 Wheezing: Secondary | ICD-10-CM | POA: Diagnosis present

## 2022-08-30 MED ORDER — DM-GUAIFENESIN ER 30-600 MG PO TB12: 1.0000 | ORAL_TABLET | Freq: Two times a day (BID) | ORAL | 0 refills | Status: DC

## 2022-08-30 MED ORDER — SPACER/AERO-HOLDING CHAMBERS DEVI: 1.0000 [IU] | Freq: Three times a day (TID) | 0 refills | Status: DC

## 2022-08-30 MED ORDER — ALBUTEROL SULFATE (2.5 MG/3ML) 0.083% IN NEBU
2.5000 mg | INHALATION_SOLUTION | Freq: Once | RESPIRATORY_TRACT | Status: AC
  Administered 2022-08-30: 2.5 mg via RESPIRATORY_TRACT

## 2022-08-30 MED ORDER — ALBUTEROL SULFATE HFA 108 (90 BASE) MCG/ACT IN AERS: 2.0000 | INHALATION_SPRAY | Freq: Four times a day (QID) | RESPIRATORY_TRACT | 0 refills | Status: DC | PRN

## 2022-08-30 MED ORDER — ALBUTEROL SULFATE (2.5 MG/3ML) 0.083% IN NEBU
INHALATION_SOLUTION | RESPIRATORY_TRACT | Status: AC
  Filled 2022-08-30: qty 3

## 2022-08-30 NOTE — Discharge Instructions (Addendum)
Advised take the Mucinex DM every 12 hours to help control cough and congestion. Advised to use the albuterol inhaler with spacer, 2 puffs every 6 hours on a regular basis to help decrease wheezing, control cough, and decrease congestion.  Advised to follow-up PCP or return to urgent care if symptoms fail to improve.  COVID test will be completed in 48 hours.  If you do not get a call from this office that indicates the test is negative.  Log onto MyChart to be the test results when it post in 48 hours.
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
## 2022-08-30 NOTE — ED Notes (Signed)
Notified [place] , [place] of patient with chest pain/sob complaint one month post partem

## 2022-08-30 NOTE — ED Triage Notes (Addendum)
Patient reports on Wednesday she started noticing sob while talking on phone.  Chest pain and cough started   Thursday, noticed sob again when walking up stairs.  Called ems.  Ems said she was wheezing, had a breathing treatment.  Ems suggested patient come to urgent care to be evaluated.    Reported ems gave a breathing treatment and felt better after the treatment.  Reports center epigastric aching, tightness, squeeze.   Patient one post partum, no birth control.  Patient is not breast feeding NO chest pain currently

## 2022-08-31 LAB — SARS CORONAVIRUS 2 (TAT 6-24 HRS): SARS Coronavirus 2: NEGATIVE

## 2022-10-04 ENCOUNTER — Other Ambulatory Visit: Payer: Self-pay

## 2022-10-04 ENCOUNTER — Emergency Department (HOSPITAL_COMMUNITY): Payer: Medicaid Other

## 2022-10-04 ENCOUNTER — Encounter (HOSPITAL_COMMUNITY): Payer: Self-pay

## 2022-10-04 ENCOUNTER — Emergency Department (HOSPITAL_COMMUNITY)
Admission: EM | Admit: 2022-10-04 | Discharge: 2022-10-05 | Disposition: A | Discharge status: Home / Self Care | Payer: Medicaid Other | Attending: Emergency Medicine | Admitting: Emergency Medicine

## 2022-10-04 DIAGNOSIS — R55 Syncope and collapse: Secondary | ICD-10-CM | POA: Insufficient documentation

## 2022-10-04 DIAGNOSIS — R200 Anesthesia of skin: Secondary | ICD-10-CM | POA: Diagnosis not present

## 2022-10-04 LAB — CBC WITH DIFFERENTIAL/PLATELET
Abs Immature Granulocytes: 0.02 10*3/uL (ref 0.00–0.07)
Basophils Absolute: 0 10*3/uL (ref 0.0–0.1)
Basophils Relative: 0 %
Eosinophils Absolute: 0.2 10*3/uL (ref 0.0–0.5)
Eosinophils Relative: 2 %
HCT: 36.2 % (ref 36.0–46.0)
Hemoglobin: 11.1 g/dL — ABNORMAL LOW (ref 12.0–15.0)
Immature Granulocytes: 0 %
Lymphocytes Relative: 16 %
Lymphs Abs: 1.4 10*3/uL (ref 0.7–4.0)
MCH: 25.9 pg — ABNORMAL LOW (ref 26.0–34.0)
MCHC: 30.7 g/dL (ref 30.0–36.0)
MCV: 84.6 fL (ref 80.0–100.0)
Monocytes Absolute: 0.6 10*3/uL (ref 0.1–1.0)
Monocytes Relative: 7 %
Neutro Abs: 6.6 10*3/uL (ref 1.7–7.7)
Neutrophils Relative %: 75 %
Platelets: 325 10*3/uL (ref 150–400)
RBC: 4.28 MIL/uL (ref 3.87–5.11)
RDW: 14.6 % (ref 11.5–15.5)
WBC: 8.9 10*3/uL (ref 4.0–10.5)
nRBC: 0 % (ref 0.0–0.2)

## 2022-10-04 LAB — COMPREHENSIVE METABOLIC PANEL
ALT: 26 U/L (ref 0–44)
AST: 26 U/L (ref 15–41)
Albumin: 3.2 g/dL — ABNORMAL LOW (ref 3.5–5.0)
Alkaline Phosphatase: 85 U/L (ref 38–126)
Anion gap: 8 (ref 5–15)
BUN: 8 mg/dL (ref 6–20)
CO2: 24 mmol/L (ref 22–32)
Calcium: 8.3 mg/dL — ABNORMAL LOW (ref 8.9–10.3)
Chloride: 107 mmol/L (ref 98–111)
Creatinine, Ser: 0.89 mg/dL (ref 0.44–1.00)
GFR, Estimated: >60 mL/min (ref >60)
Glucose, Bld: 105 mg/dL — ABNORMAL HIGH (ref 70–99)
Potassium: 3.8 mmol/L (ref 3.5–5.1)
Sodium: 139 mmol/L (ref 135–145)
Total Bilirubin: 0.4 mg/dL (ref 0.3–1.2)
Total Protein: 6.6 g/dL (ref 6.5–8.1)

## 2022-10-04 LAB — TROPONIN I (HIGH SENSITIVITY): Troponin I (High Sensitivity): 3 ng/L (ref <18)

## 2022-10-04 LAB — BRAIN NATRIURETIC PEPTIDE: B Natriuretic Peptide: 25.3 pg/mL (ref 0.0–100.0)

## 2022-10-04 MED ORDER — LACTATED RINGERS IV BOLUS
1000.0000 mL | Freq: Once | INTRAVENOUS | Status: AC
  Administered 2022-10-04: 1000 mL via INTRAVENOUS

## 2022-10-04 MED ORDER — IOHEXOL 350 MG/ML SOLN
75.0000 mL | Freq: Once | INTRAVENOUS | Status: AC | PRN
  Administered 2022-10-04: 75 mL via INTRAVENOUS

## 2022-10-04 NOTE — ED Triage Notes (Signed)
Pt BIB EMS from home. Per EMS, pt had syncopal episode today. Unsure of length of time. Pt is 8 weeks post partum. Hx of similar episode. Pt c/o "feeling numb in my whole body." Denies hitting head or any injuries. A/Ox4.

## 2022-10-04 NOTE — ED Provider Notes (Signed)
[place] EMERGENCY DEPARTMENT AT [facility] Provider Note   CSN: [id number] Arrival date & time: 10/04/22  1737     History Chief Complaint  Patient presents with   Loss of Consciousness    HPI Debbie Acevedo is a 27 y.o. female presenting for chief complaint of syncopal episode.  States that she was at her house today had a normal day otherwise she is 8 weeks postpartum child is doing well.  However approximately 45 minutes prior to arrival she starts feeling her heart race got lightheaded and dizzy started having fogginess and does not remember the exact sequence of events but ultimately collapsed at home. She denies fevers chills nausea vomiting syncope or shortness of breath otherwise.  Has been doing well.  Otherwise ambulatory tolerating p.o. intake.   Patient's recorded medical, surgical, social, medication list and allergies were reviewed in the Snapshot window as part of the initial history.   Review of Systems   Review of Systems  Constitutional:  Negative for chills and fever.  HENT:  Negative for ear pain and sore throat.   Eyes:  Negative for pain and visual disturbance.  Respiratory:  Negative for cough and shortness of breath.   Cardiovascular:  Negative for chest pain and palpitations.  Gastrointestinal:  Negative for abdominal pain and vomiting.  Genitourinary:  Negative for dysuria and hematuria.  Musculoskeletal:  Negative for arthralgias and back pain.  Skin:  Negative for color change and rash.  Neurological:  Positive for syncope. Negative for seizures.  All other systems reviewed and are negative.   Physical Exam Updated Vital Signs BP 125/67   Pulse (!) 106   Temp 98.4 F (36.9 C) (Oral)   Resp 20   Ht 5\' 6"  (1.676 m)   Wt 111.1 kg   SpO2 99%   Breastfeeding No   BMI 39.54 kg/m  Physical Exam Vitals and nursing note reviewed.  Constitutional:      General: She is not in acute distress.    Appearance: She is well-developed.   HENT:     Head: Normocephalic and atraumatic.  Eyes:     Conjunctiva/sclera: Conjunctivae normal.  Cardiovascular:     Rate and Rhythm: Normal rate and regular rhythm.     Heart sounds: No murmur heard. Pulmonary:     Effort: Pulmonary effort is normal. No respiratory distress.     Breath sounds: Normal breath sounds.  Abdominal:     General: There is no distension.     Palpations: Abdomen is soft.     Tenderness: There is no abdominal tenderness. There is no right CVA tenderness or left CVA tenderness.  Musculoskeletal:        General: No swelling or tenderness. Normal range of motion.     Cervical back: Neck supple.  Skin:    General: Skin is warm and dry.  Neurological:     General: No focal deficit present.     Mental Status: She is alert and oriented to person, place, and time. Mental status is at baseline.     Cranial Nerves: No cranial nerve deficit.      ED Course/ Medical Decision Making/ A&P    Procedures Procedures   Medications Ordered in ED Medications  lactated ringers bolus 1,000 mL (0 mLs Intravenous Stopped 10/04/22 2157)  lactated ringers bolus 1,000 mL (1,000 mLs Intravenous New Bag/Given 10/04/22 2226)  iohexol (OMNIPAQUE) 350 MG/ML injection 75 mL (75 mLs Intravenous Contrast Given 10/04/22 2135)   Medical Decision  Making:   Debbie Acevedo is a 27 y.o. female who presented to the ED today with a syncopal episode detailed above.    Complete initial physical exam performed, notably the patient  was HDS in NAD.    Reviewed and confirmed nursing documentation for past medical history, family history, social history.    Initial Assessment:   With the patient's presentation of syncope, most likely diagnosis is orthostatic hypotension vs vasovagal episode. Other diagnoses were considered including (but not limited to) arrythmogenic syncope, valvular abnormality, PE, aortic dissection. These are considered less likely due to history of present illness and  physical exam findings.   This is most consistent with an acute life/limb threatening illness complicated by underlying chronic conditions. In particular, concerning cardiac etiology, this is less likely to be the etiology given the lack of chest pain, lack of serious comorbidities including heart failure or CAD.    Initial Plan:  Given persistent tachycardia after initial bolus of IV fluid and patient's description of sudden syncope, CTA evaluation was added on to evaluate for pulmonary embolism. creening labs including CBC and Metabolic panel to evaluate for infectious or metabolic etiology of disease.  Urinalysis with reflex culture ordered to evaluate for UTI or relevant urologic/nephrologic pathology.  Troponin//EKG to evaluate for cardiac pathology in the setting of recent postpartum presentation. Evaluation for maternal cardiac disease per above. .  Objective evaluation as below reviewed after administration of IVF/Telemetry monitoring  Initial Study Results:   Laboratory  All laboratory results reviewed without evidence of clinically relevant pathology.     EKG EKG was reviewed independently. Rate, rhythm, axis, intervals all examined and without medically relevant abnormality. ST segments without concerns for elevations.    Radiology:  All images reviewed independently. Agree with radiology report at this time.   CT Angio Chest Pulmonary Embolism (PE) W or WO Contrast  Result Date: 10/04/2022 CLINICAL DATA:  Pulmonary embolism suspected, high probability. Syncopal episode, 8 weeks postpartum. EXAM: CT ANGIOGRAPHY CHEST WITH CONTRAST TECHNIQUE: Multidetector CT imaging of the chest was performed using the standard protocol during bolus administration of intravenous contrast. Multiplanar CT image reconstructions and MIPs were obtained to evaluate the vascular anatomy. RADIATION DOSE REDUCTION: This exam was performed according to the departmental dose-optimization program which  includes automated exposure control, adjustment of the mA and/or kV according to patient size and/or use of iterative reconstruction technique. CONTRAST:  75mL OMNIPAQUE IOHEXOL 350 MG/ML SOLN COMPARISON:  None Available. FINDINGS: Cardiovascular: The heart is normal in size and there is no pericardial effusion. The aorta and pulmonary trunk are normal in caliber. No definite evidence of pulmonary embolism. Examination of the segmental and subsegmental arteries is limited due to mixing artifact. Mediastinum/Nodes: No enlarged mediastinal, hilar, or axillary lymph nodes. Thyroid gland, trachea, and esophagus demonstrate no significant findings. Lungs/Pleura: Minimal atelectasis bilaterally. No effusion or pneumothorax. Upper Abdomen: The gallbladder is surgically absent. No acute abnormality. Musculoskeletal: No acute osseous abnormality. Review of the MIP images confirms the above findings. IMPRESSION: No evidence of pulmonary embolism or other acute process within limitations described above. Electronically Signed   By: Thornell Sartorius M.D.   On: 10/04/2022 21:47    Final Assessment and Plan:   History of present illness and physical exam findings reveal no acute pathology in the setting of these objective findings.  Grossly improved.  Heart rate down to 80 on reassessment.  Given resolution of symptoms, well appearance, toleration of p.o. intake patient stable for outpatient care management with primary  care provider and strict return precautions. Disposition:  I have considered need for hospitalization, however, considering all of the above, I believe this patient is stable for discharge at this time.  Patient/family educated about specific return precautions for given chief complaint and symptoms.  Patient/family educated about follow-up with PCP.     Patient/family expressed understanding of return precautions and need for follow-up. Patient spoken to regarding all imaging and laboratory results and  appropriate follow up for these results. All education provided in verbal form with additional information in written form. Time was allowed for answering of patient questions. Patient discharged.    Emergency Department Medication Summary:   Medications  lactated ringers bolus 1,000 mL (0 mLs Intravenous Stopped 10/04/22 2157)  lactated ringers bolus 1,000 mL (1,000 mLs Intravenous New Bag/Given 10/04/22 2226)  iohexol (OMNIPAQUE) 350 MG/ML injection 75 mL (75 mLs Intravenous Contrast Given 10/04/22 2135)         Clinical Impression:  1. Syncope and collapse      Discharge   Final Clinical Impression(s) / ED Diagnoses Final diagnoses:  Syncope and collapse    Rx / DC Orders ED Discharge Orders     None         Glyn Ade, MD 10/04/22 2308

## 2022-10-13 ENCOUNTER — Emergency Department (HOSPITAL_BASED_OUTPATIENT_CLINIC_OR_DEPARTMENT_OTHER)
Admission: EM | Admit: 2022-10-13 | Discharge: 2022-10-13 | Disposition: A | Discharge status: Home / Self Care | Payer: Medicaid Other | Attending: Emergency Medicine | Admitting: Emergency Medicine

## 2022-10-13 ENCOUNTER — Encounter (HOSPITAL_BASED_OUTPATIENT_CLINIC_OR_DEPARTMENT_OTHER): Payer: Self-pay | Admitting: Emergency Medicine

## 2022-10-13 ENCOUNTER — Other Ambulatory Visit: Payer: Self-pay

## 2022-10-13 ENCOUNTER — Emergency Department (HOSPITAL_BASED_OUTPATIENT_CLINIC_OR_DEPARTMENT_OTHER): Payer: Medicaid Other

## 2022-10-13 DIAGNOSIS — R202 Paresthesia of skin: Secondary | ICD-10-CM | POA: Diagnosis not present

## 2022-10-13 DIAGNOSIS — M546 Pain in thoracic spine: Secondary | ICD-10-CM | POA: Insufficient documentation

## 2022-10-13 LAB — BASIC METABOLIC PANEL
Anion gap: 7 (ref 5–15)
BUN: 16 mg/dL (ref 6–20)
CO2: 25 mmol/L (ref 22–32)
Calcium: 9 mg/dL (ref 8.9–10.3)
Chloride: 104 mmol/L (ref 98–111)
Creatinine, Ser: 0.8 mg/dL (ref 0.44–1.00)
GFR, Estimated: >60 mL/min (ref >60)
Glucose, Bld: 96 mg/dL (ref 70–99)
Potassium: 4.2 mmol/L (ref 3.5–5.1)
Sodium: 136 mmol/L (ref 135–145)

## 2022-10-13 LAB — CBC
HCT: 40.2 % (ref 36.0–46.0)
Hemoglobin: 12.6 g/dL (ref 12.0–15.0)
MCH: 25.9 pg — ABNORMAL LOW (ref 26.0–34.0)
MCHC: 31.3 g/dL (ref 30.0–36.0)
MCV: 82.5 fL (ref 80.0–100.0)
Platelets: 363 10*3/uL (ref 150–400)
RBC: 4.87 MIL/uL (ref 3.87–5.11)
RDW: 14.6 % (ref 11.5–15.5)
WBC: 8.8 10*3/uL (ref 4.0–10.5)
nRBC: 0 % (ref 0.0–0.2)

## 2022-10-13 LAB — MAGNESIUM: Magnesium: 2 mg/dL (ref 1.7–2.4)

## 2022-10-13 LAB — TSH: TSH: 1.894 u[IU]/mL (ref 0.350–4.500)

## 2022-10-13 NOTE — ED Provider Notes (Signed)
[place] EMERGENCY DEPARTMENT AT [facility] Provider Note   CSN: [id number] Arrival date & time: 10/13/22  0803     History  Chief Complaint  Patient presents with   Numbness    Debbie Acevedo is a 27 y.o. female.  Patient is a 27 year old G3, P3 who is currently 9 and half weeks postpartum, not breast-feeding presenting today with multiple complaints.  Patient was seen on 10/04/2022 after an episode of syncope where she had a negative CTA, reassuring blood work and improved with IV fluids.  She reports since that time she is just not felt herself.  Over the last 2 weeks she complains of a numb tingling sensation in bilateral lower extremities from the knee down and from bilateral upper extremities from the shoulders down.  She reports in the last few days she started feeling a cooling uncomfortable sensation in her neck that goes into her arms.  It is much worse at night and she is finding it hard to sleep due to the discomfort.  She is also noticed some chest tightness and shortness of breath that has been present intermittently and anxiety.  She had no specific complications with this pregnancy.  She did have a C-section because baby was breech but delivered at 39 weeks.  She recently started on Zoloft last week because of anxiety she has been since experiencing since baby has been born but denies prior history of this.  She feels overall weak in her arms and legs but is not dropping anything.  She is still eating and drinking.  She has not had any swelling in her legs, nausea, vomiting and has not had blood pressure issues.  She denies prior instrumentation to her neck and no history of neck pain.  She reports headache starting yesterday.  The history is provided by the patient.       Home Medications Prior to Admission medications   Medication Sig Start Date End Date Taking? Authorizing Provider  albuterol (VENTOLIN HFA) 108 (90 Base) MCG/ACT inhaler Inhale 2 puffs into  the lungs every 6 (six) hours as needed for wheezing or shortness of breath. Patient not taking: Reported on 10/04/2022 08/30/22   Ellsworth Lennox, PA-C  dextromethorphan-guaiFENesin [facility] DM) 30-600 MG 12hr tablet Take 1 tablet by mouth 2 (two) times daily. Patient not taking: Reported on 10/04/2022 08/30/22   Ellsworth Lennox, PA-C  escitalopram (LEXAPRO) 5 MG tablet Take 5 mg by mouth daily. 09/26/22   [provider]  ibuprofen (ADVIL) 600 MG tablet Take 1 tablet (600 mg total) by mouth every 6 (six) hours. Patient not taking: Reported on 10/04/2022 07/23/22   Marlow Baars, MD  oxyCODONE (OXY IR/ROXICODONE) 5 MG immediate release tablet Take 1 tablet (5 mg total) by mouth every 6 (six) hours as needed for severe pain. Patient not taking: Reported on 08/30/2022 07/23/22   Marlow Baars, MD  Prenatal Vit-Fe Fumarate-FA (MULTIVITAMIN-PRENATAL) 27-0.8 MG TABS tablet Take 1 tablet by mouth daily at 12 noon. Patient not taking: Reported on 10/04/2022    [provider]  sertraline (ZOLOFT) 50 MG tablet Take 1 tablet (50 mg total) by mouth at bedtime. Patient not taking: Reported on 10/04/2022 07/23/22   Marlow Baars, MD  Spacer/Aero-Holding Deretha Emory DEVI 1 Units by Does not apply route in the morning, at noon, and at bedtime. 08/30/22   Ellsworth Lennox, PA-C      Allergies    Patient has no known allergies.    Review of Systems   Review  of Systems  Physical Exam Updated Vital Signs BP 135/77 (BP Location: Right Arm)   Pulse 73   Temp 98.6 F (37 C) (Oral)   Resp 13   Ht 5\' 6"  (1.676 m)   Wt 112.9 kg   SpO2 99%   Breastfeeding No   BMI 40.19 kg/m  Physical Exam Vitals and nursing note reviewed.  Constitutional:      General: She is not in acute distress.    Appearance: She is well-developed.  HENT:     Head: Normocephalic and atraumatic.  Eyes:     Pupils: Pupils are equal, round, and reactive to light.  Neck:   Cardiovascular:     Rate and Rhythm: Normal rate and regular  rhythm.     Heart sounds: Normal heart sounds. No murmur heard.    No friction rub.  Pulmonary:     Effort: Pulmonary effort is normal.     Breath sounds: Normal breath sounds. No wheezing or rales.  Abdominal:     General: Bowel sounds are normal. There is no distension.     Palpations: Abdomen is soft.     Tenderness: There is no abdominal tenderness. There is no guarding or rebound.  Musculoskeletal:        General: Normal range of motion.     Cervical back: Normal range of motion and neck supple. Tenderness present.     Thoracic back: Bony tenderness present.     Lumbar back: Normal.       Back:     Right lower leg: No edema.     Left lower leg: No edema.     Comments: No edema  Skin:    General: Skin is warm and dry.     Findings: No rash.  Neurological:     Mental Status: She is alert and oriented to person, place, and time.     Cranial Nerves: No cranial nerve deficit.     Sensory: No sensory deficit.     Motor: Motor function is intact. No weakness or pronator drift.     Coordination: Coordination is intact.     Gait: Gait is intact.     Deep Tendon Reflexes:     Reflex Scores:      Patellar reflexes are 2+ on the right side and 1+ on the left side.    Comments: 5/5 strength in bilateral upper and lower ext  Psychiatric:        Mood and Affect: Mood normal.        Behavior: Behavior normal.     ED Results / Procedures / Treatments   Labs (all labs ordered are listed, but only abnormal results are displayed) Labs Reviewed  CBC - Abnormal; Notable for the following components:      Result Value   MCH 25.9 (*)    All other components within normal limits  BASIC METABOLIC PANEL  MAGNESIUM  TSH    EKG EKG Interpretation  Date/Time:  Monday Oct 13 2022 08:14:23 EDT Ventricular Rate:  66 PR Interval:  149 QRS Duration: 90 QT Interval:  383 QTC Calculation: 402 R Axis:   74 Text Interpretation: Sinus rhythm Normal ECG Confirmed by Gwyneth Sprout  [phone]) on 10/13/2022 8:30:17 AM  Radiology DG Chest Port 1 View  Result Date: 10/13/2022 CLINICAL DATA:  Shortness of breath EXAM: PORTABLE CHEST 1 VIEW COMPARISON:  07/16/2011 FINDINGS: The heart size and mediastinal contours are within normal limits. Both lungs are clear. The visualized skeletal structures are  unremarkable. IMPRESSION: No active disease. Electronically Signed   By: Judie Petit.  Shick M.D.   On: 10/13/2022 08:46    Procedures Procedures    Medications Ordered in ED Medications - No data to display  ED Course/ Medical Decision Making/ A&P                             Medical Decision Making Amount and/or Complexity of Data Reviewed External Data Reviewed: notes. Labs: ordered. Decision-making details documented in ED Course. Radiology: ordered and independent interpretation performed. Decision-making details documented in ED Course. ECG/medicine tests: ordered and independent interpretation performed. Decision-making details documented in ED Course.   Pt presenting today with a complaint that caries a high risk for morbidity and mortality.  Here with complaints of a unusual sensation in her neck that goes down bilateral arms and also in bilateral legs from the knee down.  Patient is currently dealing with some anxiety recently starting Zoloft and having some chest pain and shortness of breath.  Feel the chest pain or shortness of breath may be related to anxiety.  Patient does not have any evidence of fluid overload, normal blood pressure here and low suspicion for delayed onset preeclampsia or postpartum cardiomyopathy.  Patient did have a spinal for her C-section but reports for the first 4 weeks after delivery she was feeling fine.  Patient's distribution of symptoms possible for cervical lesion, low suspicion for epidural hematoma or infection.  Could be electrolyte abnormalities such as calcium or magnesium on a, new onset thyroid disease.  Lower suspicion for Guillain-Barr.   Will independently interpreted patient's labs and EKG which showed a normal sinus rhythm.  CBC within normal limits with normal hemoglobin, BMP, calcium and magnesium levels are all normal.  TSH is pending. I have independently visualized and interpreted pt's images today.  CXR wnl.  Patient's neuroexam without focal findings.  Low suspicion that this is Guillain-Barr as all of her symptoms started at the same time and has not been progressive.  She does still have reflexes.  However given her numbness and tingling discussed with patient MRI today versus outpatient referral to neurology and follow-up or return if symptoms worsen.  Patient reports that she would like the referral and would like to go home today.  She will continue the Zoloft at this time but follow-up with her PCP/OB if symptoms or not improving.  Also she was given return precautions.         Final Clinical Impression(s) / ED Diagnoses Final diagnoses:  Paresthesias    Rx / DC Orders ED Discharge Orders          Ordered    Ambulatory referral to Neurology       Comments: An appointment is requested in approximately: 1 week  9 weeks post partum, numbness and subjective weakness in bilateral arm and legs.  Normal reflexes.  Did have spinal for c-section.   10/13/22 4540              Gwyneth Sprout, MD 10/13/22 [phone]

## 2022-10-13 NOTE — ED Triage Notes (Signed)
Pt arrives to ED with c/o numbness x2 weeks after a syncopal episode. Was seen at [facility] for this on 4/27. She notes she got lightheaded and passed out.  She notes numbness to arms and legs. She notes a "cooling sensation" in her neck.

## 2022-10-13 NOTE — Discharge Instructions (Addendum)
The thyroid test should come back tomorrow and be on your MyChart account.  If any of that says abnormal you need to contact her OB or regular doctor so they can follow-up.  Also a referral was placed for neurology and they should call you for an appointment.  However in the meantime if your symptoms start getting significantly worse you have trouble holding her baby, walking or other issues return to the emergency room for more urgent evaluation.

## 2022-10-13 NOTE — ED Notes (Signed)
Pt discharged to home using teachback Method. Discharge instructions have been discussed with patient and/or family members. Pt verbally acknowledges understanding d/c instructions, has been given opportunity for questions to be answered, and endorses comprehension to checkout at registration before leaving.  

## 2022-11-11 ENCOUNTER — Ambulatory Visit: Payer: Medicaid Other | Admitting: Diagnostic Neuroimaging

## 2022-11-11 ENCOUNTER — Telehealth: Payer: Self-pay | Admitting: Diagnostic Neuroimaging

## 2022-11-11 ENCOUNTER — Encounter: Payer: Self-pay | Admitting: Diagnostic Neuroimaging

## 2022-11-11 VITALS — BP 127/78 | HR 92 | Ht 66.0 in | Wt 249.0 lb

## 2022-11-11 DIAGNOSIS — R200 Anesthesia of skin: Secondary | ICD-10-CM | POA: Diagnosis not present

## 2022-11-11 DIAGNOSIS — R42 Dizziness and giddiness: Secondary | ICD-10-CM

## 2022-11-11 DIAGNOSIS — R519 Headache, unspecified: Secondary | ICD-10-CM

## 2022-11-11 NOTE — Telephone Encounter (Signed)
[facility] [place]: [phone] exp. 11/11/22-01/09/23 sent to GI [phone]

## 2022-11-11 NOTE — Patient Instructions (Signed)
-   check MRI brain  - check labs

## 2022-11-11 NOTE — Progress Notes (Signed)
[facility]  PATIENT: Debbie Acevedo DOB: 01-Oct-1995  REFERRING CLINICIAN: Gwyneth Sprout, MD HISTORY FROM: patient REASON FOR VISIT: new consult   HISTORICAL  CHIEF COMPLAINT:  Chief Complaint  Patient presents with   Numbness    Rm 6 alone Pt is well, reports she has been having numbness and weakness in arms and legs since end April. She reports being in the kitchen, felt dizzy and loss consciousness. She currently has tingling in hands and lightheadedness every other day. Dizziness is not related to positional changes, it can occur at any moment.      HISTORY OF PRESENT ILLNESS:   27 year old female here for evaluation of numbness and tingling.  Symptoms started around April 2024.  Had 1 episode of whole body tingling sensation, woozy unsteady feeling.  Also had 1 episode of passing out.  Now having intermittent numbness and tingling in the fingertips and toes.  Sometimes has some headache in the back of her head.  No other prodromal accidents injuries or traumas.  No change in activity or diet.  She had emergency C-section in February 2024.   REVIEW OF SYSTEMS: Full 14 system review of systems performed and negative with exception of: as per HPI.  ALLERGIES: No Known Allergies  HOME MEDICATIONS: Outpatient Medications Prior to Visit  Medication Sig Dispense Refill   albuterol (VENTOLIN HFA) 108 (90 Base) MCG/ACT inhaler Inhale 2 puffs into the lungs every 6 (six) hours as needed for wheezing or shortness of breath. (Patient not taking: Reported on 10/04/2022) 8 g 0   dextromethorphan-guaiFENesin (MUCINEX DM) 30-600 MG 12hr tablet Take 1 tablet by mouth 2 (two) times daily. (Patient not taking: Reported on 10/04/2022) 20 tablet 0   escitalopram (LEXAPRO) 5 MG tablet Take 5 mg by mouth daily. (Patient not taking: Reported on 11/11/2022)     ibuprofen (ADVIL) 600 MG tablet Take 1 tablet (600 mg total) by mouth every 6 (six) hours. (Patient not taking:  Reported on 10/04/2022) 60 tablet 0   oxyCODONE (OXY IR/ROXICODONE) 5 MG immediate release tablet Take 1 tablet (5 mg total) by mouth every 6 (six) hours as needed for severe pain. (Patient not taking: Reported on 08/30/2022) 20 tablet 0   Prenatal Vit-Fe Fumarate-FA (MULTIVITAMIN-PRENATAL) 27-0.8 MG TABS tablet Take 1 tablet by mouth daily at 12 noon. (Patient not taking: Reported on 10/04/2022)     sertraline (ZOLOFT) 50 MG tablet Take 1 tablet (50 mg total) by mouth at bedtime. (Patient not taking: Reported on 10/04/2022) 90 tablet 3   Spacer/Aero-Holding Chambers DEVI 1 Units by Does not apply route in the morning, at noon, and at bedtime. (Patient not taking: Reported on 11/11/2022) 1 Units 0   No facility-administered medications prior to visit.    PAST MEDICAL HISTORY: Past Medical History:  Diagnosis Date   Chlamydia    first preg   Hidradenitis suppurativa 06/10/2007   in both axilla and groin    Pregnancy induced hypertension    Urinary tract infection    current, on antibiotic    PAST SURGICAL HISTORY: Past Surgical History:  Procedure Laterality Date   CESAREAN SECTION N/A 07/20/2022   Procedure: CESAREAN SECTION;  Surgeon: Lavina Hamman, MD;  Location: [facility] LD ORS;  Service: Obstetrics;  Laterality: N/A;   CHOLECYSTECTOMY N/A 10/22/2012   Procedure: LAPAROSCOPIC CHOLECYSTECTOMY WITHOUT INTRAOPERATIVE CHOLANGIOGRAM;  Surgeon: Judie Petit. Leonia Corona, MD;  Location: [facility] OR;  Service: Pediatrics;  Laterality: N/A;    FAMILY HISTORY: Family History  Problem Relation Age  of Onset   Healthy Mother    Healthy Father    Cancer Maternal Aunt    Hypertension Maternal Grandmother    Cancer Maternal Grandfather     SOCIAL HISTORY: Social History   Socioeconomic History   Marital status: Single    Spouse name: Not on file   Number of children: Not on file   Years of education: Not on file   Highest education level: Not on file  Occupational History   Not on file  Tobacco Use    Smoking status: Never   Smokeless tobacco: Never  Vaping Use   Vaping Use: Never used  Substance and Sexual Activity   Alcohol use: No   Drug use: No   Sexual activity: Not Currently    Birth control/protection: None  Other Topics Concern   Not on file  Social History Narrative   Not on file   Social Determinants of Health   Financial Resource Strain: Not on file  Food Insecurity: No Food Insecurity (07/20/2022)   Hunger Vital Sign    Worried About Running Out of Food in the Last Year: Never true    Ran Out of Food in the Last Year: Never true  Transportation Needs: No Transportation Needs (07/20/2022)   PRAPARE - Administrator, Civil Service (Medical): No    Lack of Transportation (Non-Medical): No  Physical Activity: Not on file  Stress: Not on file  Social Connections: Not on file  Intimate Partner Violence: Not At Risk (07/20/2022)   Humiliation, Afraid, Rape, and Kick questionnaire    Fear of Current or Ex-Partner: No    Emotionally Abused: No    Physically Abused: No    Sexually Abused: No     PHYSICAL EXAM  GENERAL EXAM/CONSTITUTIONAL: Vitals:  Vitals:   11/11/22 1034  BP: 127/78  Pulse: 92  Weight: 249 lb (112.9 kg)  Height: 5\' 6"  (1.676 m)   Body mass index is 40.19 kg/m. Wt Readings from Last 3 Encounters:  11/11/22 249 lb (112.9 kg)  10/13/22 249 lb (112.9 kg)  10/04/22 245 lb (111.1 kg)   Patient is in no distress; well developed, nourished and groomed; neck is supple  CARDIOVASCULAR: Examination of carotid arteries is normal; no carotid bruits Regular rate and rhythm, no murmurs Examination of peripheral vascular system by observation and palpation is normal  EYES: Ophthalmoscopic exam of optic discs and posterior segments is normal; no papilledema or hemorrhages No results found.  MUSCULOSKELETAL: Gait, strength, tone, movements noted in Neurologic exam below  NEUROLOGIC: MENTAL STATUS:      No data to display          awake, alert, oriented to person, place and time recent and remote memory intact normal attention and concentration language fluent, comprehension intact, naming intact fund of knowledge appropriate  CRANIAL NERVE:  2nd - no papilledema on fundoscopic exam 2nd, 3rd, 4th, 6th - pupils equal and reactive to light, visual fields full to confrontation, extraocular muscles intact, no nystagmus 5th - facial sensation symmetric 7th - facial strength symmetric 8th - hearing intact 9th - palate elevates symmetrically, uvula midline 11th - shoulder shrug symmetric 12th - tongue protrusion midline  MOTOR:  normal bulk and tone, full strength in the BUE, BLE  SENSORY:  normal and symmetric to light touch, temperature, vibration  COORDINATION:  finger-nose-finger, fine finger movements normal  REFLEXES:  deep tendon reflexes present and symmetric  GAIT/STATION:  narrow based gait     DIAGNOSTIC DATA (  LABS, IMAGING, TESTING) - I reviewed patient records, labs, notes, testing and imaging myself where available.  Lab Results  Component Value Date   WBC 8.8 10/13/2022   HGB 12.6 10/13/2022   HCT 40.2 10/13/2022   MCV 82.5 10/13/2022   PLT 363 10/13/2022      Component Value Date/Time   NA 136 10/13/2022 0816   K 4.2 10/13/2022 0816   CL 104 10/13/2022 0816   CO2 25 10/13/2022 0816   GLUCOSE 96 10/13/2022 0816   BUN 16 10/13/2022 0816   CREATININE 0.80 10/13/2022 0816   CREATININE 0.79 07/14/2011 0931   CALCIUM 9.0 10/13/2022 0816   PROT 6.6 10/04/2022 1800   ALBUMIN 3.2 (L) 10/04/2022 1800   AST 26 10/04/2022 1800   ALT 26 10/04/2022 1800   ALKPHOS 85 10/04/2022 1800   BILITOT 0.4 10/04/2022 1800   GFRNONAA >60 10/13/2022 0816   GFRAA >60 07/11/2019 1443   No results found for: "CHOL", "HDL", "LDLCALC", "LDLDIRECT", "TRIG", "CHOLHDL" No results found for: "HGBA1C" No results found for: "VITAMINB12" Lab Results  Component Value Date   TSH 1.894 10/13/2022        ASSESSMENT AND PLAN  27 y.o. year old female here with:   Dx:  1. Dizziness   2. Numbness   3. Occipital headache      PLAN:  HEADACHES, NUMBNESS, DIZZINESS (since April 2024) - check MRI brain (rule out multiple sclerosis / demyelinating disease) - check labs  Orders Placed This Encounter  Procedures   MR BRAIN W WO CONTRAST   Vitamin B12   Hemoglobin A1c   Return for pending if symptoms worsen or fail to improve, pending test results.    Suanne Marker, MD 11/11/2022, 11:00 AM Certified in Neurology, Neurophysiology and Neuroimaging  [facility] [address], [address] [place], [place] [address] [phone]

## 2022-11-12 LAB — VITAMIN B12: Vitamin B-12: 598 pg/mL (ref 232–1245)

## 2022-11-12 LAB — HEMOGLOBIN A1C
Est. average glucose Bld gHb Est-mCnc: 120 mg/dL
Hgb A1c MFr Bld: 5.8 % — ABNORMAL HIGH (ref 4.8–5.6)

## 2022-12-06 ENCOUNTER — Other Ambulatory Visit: Payer: Medicaid Other

## 2023-08-14 ENCOUNTER — Telehealth: Payer: Self-pay

## 2023-08-14 ENCOUNTER — Ambulatory Visit: Admitting: Student

## 2023-08-14 ENCOUNTER — Telehealth: Payer: Self-pay | Admitting: Student

## 2023-08-14 VITALS — BP 124/89 | HR 93 | Ht 66.0 in | Wt 268.2 lb

## 2023-08-14 DIAGNOSIS — R21 Rash and other nonspecific skin eruption: Secondary | ICD-10-CM | POA: Diagnosis not present

## 2023-08-14 DIAGNOSIS — F322 Major depressive disorder, single episode, severe without psychotic features: Secondary | ICD-10-CM | POA: Diagnosis not present

## 2023-08-14 MED ORDER — TRIAMCINOLONE ACETONIDE 0.5 % EX OINT
1.0000 | TOPICAL_OINTMENT | Freq: Two times a day (BID) | CUTANEOUS | 0 refills | Status: AC
Start: 2023-08-14 — End: ?

## 2023-08-14 NOTE — Telephone Encounter (Signed)
 Patient calls nurse line requesting to speak with Dr. Laroy Apple.   She reports she saw her this morning for depressed mood. She reports after she left this morning she did some "reflective" thinking.   She reports she feels she needs time away from work. She reports she did not have a lot of time off post partum.  Patient advised to discuss with her HR department for appropriate forms such as FMLA.   She plans to discuss with HR today and have forms faxed over.   Advised behavorial health immediately for thoughts of suicide and/or harming others. She denies at this time.

## 2023-08-14 NOTE — Telephone Encounter (Signed)
 Called patient and confirmed DOB. Patient states she actually does not have any active plans of SI.  She states that she sometimes has thoughts but denies any action to any of these and denies any solid plan.  States that she will follow-up in our clinic and I scheduled visit with her PCP in 2 weeks 3/18.  I also recommended starting Kenalog for her skin lesion and removing her necklace in case there is a nickel contact/contact irritation.  Patient appreciated call.  She is interested in possible FMLA, will defer to PCP on visit 3/18.

## 2023-08-14 NOTE — Telephone Encounter (Signed)
 Called patient x4 no response - left VM to return call immediately

## 2023-08-14 NOTE — Progress Notes (Signed)
 SUBJECTIVE:   CHIEF COMPLAINT / HPI: Rash + Depression  Had patient's daughter stepped out of room for conversation  Depression The patient also reports ongoing struggles with anxiety and depression since the birth of her child a year ago. She reports feeling overwhelmed and has had thoughts of self-harm, such as being hit by a car while driving. She has tried multiple medications, including Lexapro and Zoloft, but found them unhelpful and stopped taking them due to unpleasant side effects (not feeling like herself)  The patient also expresses concern about her weight, stating that "she has never been as big as she is now", even after having her two other children. She has tried various methods to lose weight, including meal prepping and going to the gym, but has found it difficult to maintain these habits due to her schedule and responsibilities.  The patient's mental health issues are affecting her ability to work and care for her child, and she expresses a desire for help. She has considered taking a leave of absence from work and seeking therapy, but is concerned about the financial implications of taking time off work and the potential wait time for therapy.    08/14/2023    9:39 AM 01/27/2019   11:34 AM 12/24/2018    4:12 PM 12/16/2018   10:33 AM 10/12/2018    9:46 AM  Depression screen PHQ 2/9  Decreased Interest 2 0 0 0 0  Down, Depressed, Hopeless 3 0 0 0 0  PHQ - 2 Score 5 0 0 0 0  Altered sleeping 3      Tired, decreased energy 3      Change in appetite 3      Feeling bad or failure about yourself  3      Trouble concentrating 0      Moving slowly or fidgety/restless 3      Suicidal thoughts 0      PHQ-9 Score 20      Difficult doing work/chores Very difficult        Rash Presents with a persistent, burning rash that has been present since delivery 1 year ago  Initially on chest only now has spot on groin The rash is keeping her up at night due to the discomfort. Using  eucerin/vaseline on it  PERTINENT  PMH / PSH: HS,   OBJECTIVE:   BP 124/89   Pulse 93   Ht 5\' 6"  (1.676 m)   Wt 268 lb 3.2 oz (121.7 kg)   SpO2 97%   BMI 43.29 kg/m    General: Well appearing, NAD, awake, alert, responsive to questions Head: Normocephalic atraumatic CV: Regular rate and rhythm no murmurs rubs or gallops Respiratory: Clear to ausculation bilaterally, no wheezes rales or crackles, chest rises symmetrically,  no increased work of breathing Abdomen: Soft, non-tender, non-distended, normoactive bowel sounds  Extremities: Moves upper and lower extremities freely, no edema in LE Neuro: No focal deficits Skin: No rashes or lesions visualized    ASSESSMENT/PLAN:   Assessment & Plan Current severe episode of major depressive disorder without psychotic features without prior episode (HCC) Elevated PHQ-9 and does have SI (given that she had a plan).  -Gave resources for behavioral health urgent care, she does seem willing and able to go to [facility] on own. (Has daughter with her-she will drop her off at school and go to [facility] after) -Would like formal behavioral health evaluation and recommendations for whether inpatient treatment required versus outpatient with medication/close follow up -  988 provided -Discussed with Somya I will call Glade Stanford [phone]) if unable to reach her to discuss how she is doing + whether she went to the urgent care which she agreed to Rash Did not have extensive amount of time to discuss rash today but did document picture. ?  Contact/nickel allergy versus eczematous patch? -Will call patient this afternoon for above problem and send in triamcinolone ointment and recommend keeping necklaces off to see if this may help -2-week follow-up   Levin Erp, MD [facility]

## 2023-08-14 NOTE — Patient Instructions (Addendum)
 It was great to see you! Thank you for allowing me to participate in your care!   Our plans for today:  - I recommend following with Behavioral Health Urgent Care today for evaluation  Take care and seek immediate care sooner if you develop any concerns.  Levin Erp, MD   If you are feeling suicidal or depression symptoms worsen please immediately go to:   If you are thinking about harming yourself or having thoughts of suicide, or if you know someone who is, seek help right away. If you are in crisis, make sure you are not left alone.  If someone else is in crisis, make sure he/she/they is not left alone  Call 988 OR 1-800-273-TALK  24 Hour Availability for Walk-IN services  [facility]  [address] [place], [place] [phone] Crisis [phone]    Other crisis resources:  [facility] [employer] (Domestic Violence, Rape & Victim Assistance [phone]  [facility] [employer] [facility]    (ONLY from 8am-4pm)    [phone]  [facility] Mobile Crisis Unit (24/7)   [phone]  [place] National Suicide Hotline   [phone] Len Childs)

## 2023-08-27 NOTE — Telephone Encounter (Signed)
Clinical info completed on FMLA form.  Placed form in PCP's box for completion.    When form is completed, please route note to "RN Team" and place in wall pocket in front office.   Salvatore Marvel, CMA

## 2023-08-31 NOTE — Telephone Encounter (Signed)
 Spoke with patient to make appt for FMLA. Tried to make it with her PCP, but she needs a morning. The time frame that she needs the paper doesn't work done. Dr. Barb Merino schedule doesn't work for her, So I made an appt for her with Dr. Laroy Apple.Aquilla Solian, CMA

## 2023-09-01 NOTE — Telephone Encounter (Signed)
 Patient has appt on 3/26 with PCP. Aquilla Solian, CMA

## 2023-09-01 NOTE — Telephone Encounter (Signed)
 Correction. Patient has appt with Dr. Laroy Apple. Aquilla Solian, CMA

## 2023-09-01 NOTE — Progress Notes (Unsigned)
    SUBJECTIVE:   CHIEF COMPLAINT / HPI: Discuss FMLA  Discussed the use of AI scribe software for clinical note transcription with the patient, who gave verbal consent to proceed.  History of Present Illness The patient, with a history of depression and anxiety, presents with a persistent rash. Despite using triamcinolone cream twice daily, the rash has not significantly improved and continues to cause itchiness. The patient has been dealing with this rash for an extended period. The patient's mother suspects it could be psoriasis, leading the patient to request a referral to a dermatologist. She does rub her chest a lot due to anxiety. She denies any new necklackes, took off last one.  The patient's depression and anxiety symptoms began after giving birth in February 2024. She reports feeling a mix of depression and anxiety, with the severity varying day by day. She has been struggling with these symptoms at work, often falling asleep in front of the computer and anxiety with calls. She has not been taking any medication for these mental health issues, due to a dislike of pills and anxiety about taking them. She is not currently in therapy but has access to a therapist through her job. No passive or active SI.     09/02/2023    9:15 AM 08/14/2023    9:39 AM 01/27/2019   11:34 AM 12/24/2018    4:12 PM 12/16/2018   10:33 AM  Depression screen PHQ 2/9  Decreased Interest 3 2 0 0 0  Down, Depressed, Hopeless 3 3 0 0 0  PHQ - 2 Score 6 5 0 0 0  Altered sleeping 2 3     Tired, decreased energy 3 3     Change in appetite 3 3     Feeling bad or failure about yourself  2 3     Trouble concentrating 0 0     Moving slowly or fidgety/restless 0 3     Suicidal thoughts 0 0     PHQ-9 Score 16 20     Difficult doing work/chores Very difficult Very difficult       PERTINENT  PMH / PSH: reviewed  OBJECTIVE:   BP 120/82   Pulse 81   Ht 5\' 6"  (1.676 m)   Wt 266 lb 6.4 oz (120.8 kg)   LMP 08/12/2023  (Approximate)   SpO2 98%   BMI 43.00 kg/m   General: Well appearing, NAD, awake, alert, responsive to questions Psych: anxious appearing, does not appear to be responding to internal stimuli Head: Normocephalic atraumatic Respiratory: chest rises symmetrically,  no increased work of breathing Skin: scaly patches on chest    ASSESSMENT/PLAN:   Assessment & Plan  Assessment & Plan Current severe episode of major depressive disorder without psychotic features without prior episode (HCC) Current exacerbation affecting work. Prefers non-pharmacological management. No suicidal ideation. - Pt to initiate therapy initiation through workplace resources. - Completed FMLA paperwork (in [place])  and given to patient for therapy and flare-ups, allowing up to two days off per week for 6 weeks - 1 month follow up with PCP Rash Persistent rash with pruritus despite triamcinolone. Possibly due to constantly rubbing this area. Not improving with triamcinolone. - Refer to dermatology clinic for evaluation and possible biopsy   Levin Erp, MD [facility]

## 2023-09-02 ENCOUNTER — Ambulatory Visit (INDEPENDENT_AMBULATORY_CARE_PROVIDER_SITE_OTHER): Admitting: Student

## 2023-09-02 VITALS — BP 120/82 | HR 81 | Ht 66.0 in | Wt 266.4 lb

## 2023-09-02 DIAGNOSIS — R21 Rash and other nonspecific skin eruption: Secondary | ICD-10-CM | POA: Diagnosis not present

## 2023-09-02 DIAGNOSIS — F322 Major depressive disorder, single episode, severe without psychotic features: Secondary | ICD-10-CM | POA: Diagnosis not present

## 2023-09-02 NOTE — Patient Instructions (Addendum)
 It was great to see you! Thank you for allowing me to participate in your care!   Our plans for today:  - I filled out FMLA form for you - 1 month follow up in our clinic with Dr. Barb Merino - Please establish with therapist - I am going to message to get dermatology clinic appointment scheduled  Take care and seek immediate care sooner if you develop any concerns.  Levin Erp, MD                         Contains text generated by Abridge.                                 Contains text generated by Abridge.

## 2023-09-03 ENCOUNTER — Telehealth: Payer: Self-pay | Admitting: Family Medicine

## 2023-09-03 NOTE — Telephone Encounter (Signed)
 Called patient to schedule Dermatology appointment, LVM for patient to call back, If patient calls back. Please let me know so I can schedule.   Appointment Note: Chest rash   Thanks!

## 2023-09-17 ENCOUNTER — Telehealth: Payer: Self-pay

## 2023-09-17 DIAGNOSIS — L732 Hidradenitis suppurativa: Secondary | ICD-10-CM | POA: Diagnosis not present

## 2023-09-17 DIAGNOSIS — F330 Major depressive disorder, recurrent, mild: Secondary | ICD-10-CM | POA: Diagnosis not present

## 2023-09-17 DIAGNOSIS — L2989 Other pruritus: Secondary | ICD-10-CM | POA: Diagnosis not present

## 2023-09-17 DIAGNOSIS — Z6841 Body Mass Index (BMI) 40.0 and over, adult: Secondary | ICD-10-CM | POA: Diagnosis not present

## 2023-09-17 NOTE — Telephone Encounter (Signed)
 Returned call to patient. Scheduled patient for 10/07/23 with Dr. Barb Merino.   Veronda Prude, RN

## 2023-09-17 NOTE — Telephone Encounter (Signed)
 Patient calls nurse line regarding FMLA paperwork.   She reports that this was previously filled out at visit with Dr. Laroy Apple on 09/02/23. Return to work was supposed to be on 10/14/23, however, patient feels that she will need more time off from work.   She is requesting that FMLA be extended with a return date of 01/08/24, as her employer allows for up to 12 weeks for FMLA.   She is going to bring new copy of paperwork tomorrow morning. Advised that she may need additional appointment to discuss extending FMLA paperwork.   Advised that our office would call her if she needs to schedule another appointment.   Veronda Prude, RN

## 2023-10-06 NOTE — Progress Notes (Unsigned)
    SUBJECTIVE:   CHIEF COMPLAINT / HPI:   Here to discuss FMLA for depression and anxiety Symptoms began after giving birth in February 2024 Symptoms were affecting work. Patient saw Dr. Leocadia Rains 09/02/2023.  At that time declined pharmacologic therapy but planned to initiate CBT.  FMLA was given at that time for up to 2 days off per week for 6 weeks. Patient requesting that FMLA be extended for 12 weeks with a return date of 01/08/2024 ***  PERTINENT  PMH / PSH: ***  OBJECTIVE:   There were no vitals taken for this visit.  ***  ASSESSMENT/PLAN:   Assessment & Plan    Edison Gore, MD [facility]

## 2023-10-07 ENCOUNTER — Encounter: Payer: Self-pay | Admitting: Family Medicine

## 2023-10-07 ENCOUNTER — Ambulatory Visit: Admitting: Family Medicine

## 2023-10-07 VITALS — BP 124/79 | HR 93 | Ht 66.0 in | Wt 268.6 lb

## 2023-10-07 DIAGNOSIS — F419 Anxiety disorder, unspecified: Secondary | ICD-10-CM | POA: Diagnosis not present

## 2023-10-07 MED ORDER — HYDROXYZINE HCL 10 MG PO TABS
10.0000 mg | ORAL_TABLET | Freq: Three times a day (TID) | ORAL | 0 refills | Status: AC | PRN
Start: 2023-10-07 — End: ?

## 2023-10-07 NOTE — Patient Instructions (Addendum)
 I have prescribed Atarax for you to use up to 3 times daily as needed for anxiety/panic attacks.  You could also start by using this at night to help with sleep.  This medicine can be sedating so use caution with driving, heavy machinery, childcare, etc.  Check out the [employer] website and Instagram page for more information about free events and outdoor activities  You can also look at the [place] [employer] for local events and run/walk clubs  These are good ways to meet people, socialize, and exercise with low stakes

## 2023-10-08 ENCOUNTER — Ambulatory Visit

## 2023-10-16 DIAGNOSIS — R519 Headache, unspecified: Secondary | ICD-10-CM | POA: Diagnosis not present

## 2023-10-16 DIAGNOSIS — Z6841 Body Mass Index (BMI) 40.0 and over, adult: Secondary | ICD-10-CM | POA: Diagnosis not present

## 2023-10-16 DIAGNOSIS — F411 Generalized anxiety disorder: Secondary | ICD-10-CM | POA: Diagnosis not present

## 2024-01-15 ENCOUNTER — Ambulatory Visit: Admitting: Family Medicine

## 2024-01-15 ENCOUNTER — Encounter: Payer: Self-pay | Admitting: Family Medicine

## 2024-01-15 VITALS — BP 120/81 | HR 86 | Ht 66.0 in | Wt 273.0 lb

## 2024-01-15 DIAGNOSIS — F419 Anxiety disorder, unspecified: Secondary | ICD-10-CM | POA: Diagnosis not present

## 2024-01-15 DIAGNOSIS — F32A Depression, unspecified: Secondary | ICD-10-CM

## 2024-01-15 NOTE — Progress Notes (Signed)
    SUBJECTIVE:   CHIEF COMPLAINT / HPI:   Here to discuss FMLA for depression and anxiety Her symptoms began after giving birth in February 2020 for and her symptoms were affecting her work At her last visit with me on 4/30 her FMLA was extended for 12 weeks with a return date of 01/08/2024 Today requesting extension until end of December so she can attend therapy  Today reports she is doing much better. She is looking into a cycling club. 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 Work is going better. Therapy - Feels this is very beneficial. Hoping to continue Denies SI/HI She tried atarax  but isn't using it a lot      01/15/2024    3:12 PM 10/07/2023    9:02 AM 09/02/2023    9:15 AM  PHQ9 SCORE ONLY  PHQ-9 Total Score 0 14 16     PERTINENT  PMH / PSH: anxiety, MDD  OBJECTIVE:   BP 120/81   Pulse 86   Ht 5' 6 (1.676 m)   Wt 273 lb (123.8 kg)   LMP 12/21/2023   SpO2 98%   BMI 44.06 kg/m   General: NAD, pleasant, able to participate in exam Respiratory: No respiratory distress Skin: warm and dry, no rashes noted Psych: Normal affect and mood  ASSESSMENT/PLAN:   Assessment & Plan Anxiety and depression Doing much better today, reassuring Still ongoing therapy, benefiting a lot from this Ok to extend [facility] until December, advised she can drop off form   Payton Coward, MD [facility]

## 2024-01-15 NOTE — Patient Instructions (Signed)
 Nice to see you today.  Glad things are going well.  Let me know whenever your FMLA forms are needed

## 2024-01-29 ENCOUNTER — Ambulatory Visit: Admitting: Family Medicine

## 2024-01-29 NOTE — Progress Notes (Deleted)
    SUBJECTIVE:   CHIEF COMPLAINT / HPI:   Follow-up for depression Last seen by me on 8/8, at that time was doing much better and benefiting from ongoing therapy ***  PERTINENT  PMH / PSH: ***  OBJECTIVE:   LMP 12/21/2023   ***  ASSESSMENT/PLAN:   Assessment & Plan    Debbie Coward, MD [facility]

## 2024-02-05 ENCOUNTER — Encounter: Payer: Self-pay | Admitting: Family Medicine

## 2024-02-05 ENCOUNTER — Ambulatory Visit (INDEPENDENT_AMBULATORY_CARE_PROVIDER_SITE_OTHER): Admitting: Family Medicine

## 2024-02-05 ENCOUNTER — Telehealth: Payer: Self-pay | Admitting: Family Medicine

## 2024-02-05 VITALS — BP 114/97 | HR 83 | Ht 66.0 in | Wt 266.6 lb

## 2024-02-05 DIAGNOSIS — F32A Depression, unspecified: Secondary | ICD-10-CM | POA: Diagnosis not present

## 2024-02-05 DIAGNOSIS — F419 Anxiety disorder, unspecified: Secondary | ICD-10-CM

## 2024-02-05 MED ORDER — FLUOXETINE HCL 20 MG PO TABS
ORAL_TABLET | ORAL | 3 refills | Status: DC
Start: 2024-02-05 — End: 2024-03-08

## 2024-02-05 NOTE — Progress Notes (Signed)
    SUBJECTIVE:   CHIEF COMPLAINT / HPI:   Follow-up for depression Last seen by me on 8/8, at that time was doing much better and benefiting from ongoing therapy. Today feels worse. Feels that she has been in denial and truly is still having depressive episode Recently saw therapist and came to conclusion that she needs a break from work Hoping to have 43mo leave from work 8/14 - 10/1. She has discussed this with her job already In the meantime interested in starting SSRI - has previously tried zoloft  and didn't like it, ok with trying prozac  Using hydoroxizine multiple times a week Sees therapist twice a week   PERTINENT  PMH / PSH: anxiety, depression  OBJECTIVE:   BP (!) 114/97   Pulse 83   Ht 5' 6 (1.676 m)   Wt 266 lb 9.6 oz (120.9 kg)   LMP 12/21/2023   SpO2 95%   BMI 43.03 kg/m   General: NAD, pleasant, able to participate in exam Respiratory: No respiratory distress Skin: warm and dry, no rashes noted Psych: tearful, anxious mood     02/05/2024   10:56 AM 01/15/2024    3:12 PM 10/07/2023    9:02 AM  PHQ9 SCORE ONLY  PHQ-9 Total Score 3 0  14      Data saved with a previous flowsheet row definition     ASSESSMENT/PLAN:   Assessment & Plan Anxiety and depression Symptoms uncontrolled with PRN atarax , now interested in SSRI Job is likely source of majority of stress in addition to caring for young children PHQ9 3, no SI/HI, but very tearful and clearly would benefit from time away from her stressful job to decompress Agreed on dates 8/14-10/1 per her discussion with her workplace Will sign FMLA when available  Additionally start Prozac  40mg  daily (20mg  every day for first week to avoid precipitating panic/severe anxiety) F/u in about 6 weeks after restarting work and hopefully by then prozac  will be taking effect Continue twice weekly therapy sessions, these seem to be beneficial for her  BP likely elevated 2/2 stress related to above and 2 children in the  room with her F/u next visit  Payton Coward, MD [facility]

## 2024-02-05 NOTE — Telephone Encounter (Signed)
 Placed patient FMLA paperwork in your box to be completed.

## 2024-02-05 NOTE — Telephone Encounter (Signed)
 Patient dropped off form at front desk for [facility].  Verified that patient section of form has been completed.  Last DOS/WCC with PCP was 01/15/2024.  Placed form in [facility] team folder to be completed by clinical staff.  Chiquita POUR Benfield

## 2024-02-05 NOTE — Telephone Encounter (Signed)
Reviewed, completed, and signed form.  Note routed to RN team inbasket and placed completed form in RN Wall pocket in the front office.    Eathan Groman, MD  

## 2024-02-05 NOTE — Patient Instructions (Signed)
 Take prozac  - 1 tablet (20mg ) daily for 7 days Then, take 2 tablets (40mg ) daily  Follow up in 4-6 weeks Drop off FMLA when you have it   Continue atarax  as needed for anxiety attacks

## 2024-02-09 NOTE — Telephone Encounter (Signed)
 Attempted to call patient to advise of form completion. She did not answer, unable to leave message.   Patient indicated that forms were to be faxed to employer. ROI on file.   Faxed to provided number. Copy made and placed in batch scanning.   I will also place a copy up front if patient would like for her records.   Please advise of this if patient calls back.   Chiquita JAYSON English, RN

## 2024-03-07 ENCOUNTER — Other Ambulatory Visit: Payer: Self-pay | Admitting: Family Medicine

## 2024-03-07 DIAGNOSIS — F419 Anxiety disorder, unspecified: Secondary | ICD-10-CM
